# Patient Record
Sex: Female | Born: 1950 | Race: White | Hispanic: No | Marital: Married | Smoking: Never smoker
Health system: Southern US, Community
[De-identification: ages and names within clinical notes are randomized; demographics above are authoritative.]

## PROBLEM LIST (undated history)

## (undated) DIAGNOSIS — M5136 Other intervertebral disc degeneration, lumbar region: Secondary | ICD-10-CM

## (undated) DIAGNOSIS — J45909 Unspecified asthma, uncomplicated: Secondary | ICD-10-CM

## (undated) DIAGNOSIS — S42309A Unspecified fracture of shaft of humerus, unspecified arm, initial encounter for closed fracture: Secondary | ICD-10-CM

## (undated) DIAGNOSIS — I10 Essential (primary) hypertension: Secondary | ICD-10-CM

## (undated) DIAGNOSIS — M109 Gout, unspecified: Secondary | ICD-10-CM

## (undated) DIAGNOSIS — M5030 Other cervical disc degeneration, unspecified cervical region: Secondary | ICD-10-CM

## (undated) DIAGNOSIS — N9489 Other specified conditions associated with female genital organs and menstrual cycle: Secondary | ICD-10-CM

## (undated) DIAGNOSIS — F419 Anxiety disorder, unspecified: Secondary | ICD-10-CM

## (undated) DIAGNOSIS — F1020 Alcohol dependence, uncomplicated: Secondary | ICD-10-CM

## (undated) DIAGNOSIS — M51369 Other intervertebral disc degeneration, lumbar region without mention of lumbar back pain or lower extremity pain: Secondary | ICD-10-CM

## (undated) HISTORY — DX: Other cervical disc degeneration, unspecified cervical region: M50.30

## (undated) HISTORY — DX: Other intervertebral disc degeneration, lumbar region: M51.36

## (undated) HISTORY — DX: Anxiety disorder, unspecified: F41.9

## (undated) HISTORY — DX: Other specified conditions associated with female genital organs and menstrual cycle: N94.89

## (undated) HISTORY — DX: Other intervertebral disc degeneration, lumbar region without mention of lumbar back pain or lower extremity pain: M51.369

## (undated) HISTORY — DX: Alcohol dependence, uncomplicated: F10.20

## (undated) HISTORY — DX: Unspecified asthma, uncomplicated: J45.909

## (undated) HISTORY — PX: LAPAROTOMY: SHX154

---

## 1988-06-07 HISTORY — PX: COLOSTOMY: SHX63

## 1988-06-07 HISTORY — PX: COLON SURGERY: SHX602

## 1989-06-07 HISTORY — PX: COLOSTOMY REVERSAL: SHX5782

## 2004-05-14 ENCOUNTER — Ambulatory Visit: Payer: Self-pay | Admitting: Family Medicine

## 2004-05-18 ENCOUNTER — Ambulatory Visit: Payer: Self-pay | Admitting: *Deleted

## 2004-06-08 ENCOUNTER — Ambulatory Visit: Payer: Self-pay | Admitting: Family Medicine

## 2004-09-23 ENCOUNTER — Emergency Department (HOSPITAL_COMMUNITY): Admission: EM | Admit: 2004-09-23 | Discharge: 2004-09-23 | Payer: Self-pay | Admitting: Emergency Medicine

## 2004-10-06 ENCOUNTER — Ambulatory Visit: Payer: Self-pay | Admitting: Family Medicine

## 2004-10-18 ENCOUNTER — Emergency Department (HOSPITAL_COMMUNITY): Admission: EM | Admit: 2004-10-18 | Discharge: 2004-10-18 | Payer: Self-pay | Admitting: Emergency Medicine

## 2005-03-23 ENCOUNTER — Encounter: Admission: RE | Admit: 2005-03-23 | Discharge: 2005-03-23 | Payer: Self-pay | Admitting: General Surgery

## 2005-05-03 ENCOUNTER — Emergency Department (HOSPITAL_COMMUNITY): Admission: EM | Admit: 2005-05-03 | Discharge: 2005-05-03 | Payer: Self-pay | Admitting: Emergency Medicine

## 2009-11-10 ENCOUNTER — Emergency Department (HOSPITAL_COMMUNITY): Admission: EM | Admit: 2009-11-10 | Discharge: 2009-11-10 | Payer: Self-pay | Admitting: Emergency Medicine

## 2013-04-10 ENCOUNTER — Observation Stay (HOSPITAL_COMMUNITY)
Admission: EM | Admit: 2013-04-10 | Discharge: 2013-04-11 | Disposition: A | Discharge status: Home / Self Care | Payer: BC Managed Care – PPO | Attending: Internal Medicine | Admitting: Internal Medicine

## 2013-04-10 ENCOUNTER — Emergency Department (HOSPITAL_COMMUNITY): Payer: BC Managed Care – PPO

## 2013-04-10 ENCOUNTER — Encounter (HOSPITAL_COMMUNITY): Payer: Self-pay | Admitting: Emergency Medicine

## 2013-04-10 DIAGNOSIS — M009 Pyogenic arthritis, unspecified: Secondary | ICD-10-CM

## 2013-04-10 DIAGNOSIS — M109 Gout, unspecified: Principal | ICD-10-CM | POA: Insufficient documentation

## 2013-04-10 DIAGNOSIS — L03115 Cellulitis of right lower limb: Secondary | ICD-10-CM | POA: Diagnosis present

## 2013-04-10 DIAGNOSIS — I10 Essential (primary) hypertension: Secondary | ICD-10-CM | POA: Diagnosis present

## 2013-04-10 DIAGNOSIS — L089 Local infection of the skin and subcutaneous tissue, unspecified: Secondary | ICD-10-CM

## 2013-04-10 DIAGNOSIS — L02619 Cutaneous abscess of unspecified foot: Secondary | ICD-10-CM | POA: Insufficient documentation

## 2013-04-10 HISTORY — DX: Unspecified fracture of shaft of humerus, unspecified arm, initial encounter for closed fracture: S42.309A

## 2013-04-10 HISTORY — DX: Essential (primary) hypertension: I10

## 2013-04-10 LAB — BASIC METABOLIC PANEL
BUN: 4 mg/dL — ABNORMAL LOW (ref 6–23)
CO2: 27 mEq/L (ref 19–32)
Calcium: 9.6 mg/dL (ref 8.4–10.5)
Chloride: 95 mEq/L — ABNORMAL LOW (ref 96–112)
GFR calc Af Amer: >90 mL/min (ref >90)
GFR calc non Af Amer: 88 mL/min — ABNORMAL LOW (ref >90)
Glucose, Bld: 108 mg/dL — ABNORMAL HIGH (ref 70–99)
Potassium: 3.4 mEq/L — ABNORMAL LOW (ref 3.5–5.1)
Sodium: 134 mEq/L — ABNORMAL LOW (ref 135–145)

## 2013-04-10 LAB — CBC WITH DIFFERENTIAL/PLATELET
Basophils Relative: 1 % (ref 0–1)
Eosinophils Relative: 0 % (ref 0–5)
Hemoglobin: 13.5 g/dL (ref 12.0–15.0)
Lymphocytes Relative: 9 % — ABNORMAL LOW (ref 12–46)
Lymphs Abs: 1.4 10*3/uL (ref 0.7–4.0)
MCH: 30.5 pg (ref 26.0–34.0)
MCHC: 35.5 g/dL (ref 30.0–36.0)
MCV: 85.8 fL (ref 78.0–100.0)
Monocytes Relative: 6 % (ref 3–12)
Neutro Abs: 13.1 10*3/uL — ABNORMAL HIGH (ref 1.7–7.7)
Neutrophils Relative %: 84 % — ABNORMAL HIGH (ref 43–77)
Platelets: 299 10*3/uL (ref 150–400)
RBC: 4.43 MIL/uL (ref 3.87–5.11)
RDW: 12.7 % (ref 11.5–15.5)
WBC: 15.6 10*3/uL — ABNORMAL HIGH (ref 4.0–10.5)

## 2013-04-10 MED ORDER — HYDROMORPHONE HCL PF 1 MG/ML IJ SOLN
1.0000 mg | Freq: Once | INTRAMUSCULAR | Status: AC
  Administered 2013-04-10: 1 mg via INTRAMUSCULAR
  Filled 2013-04-10: qty 1

## 2013-04-10 MED ORDER — KETOROLAC TROMETHAMINE 60 MG/2ML IM SOLN
60.0000 mg | Freq: Once | INTRAMUSCULAR | Status: AC
  Administered 2013-04-10: 60 mg via INTRAMUSCULAR
  Filled 2013-04-10: qty 2

## 2013-04-10 MED ORDER — OXYCODONE-ACETAMINOPHEN 5-325 MG PO TABS
1.0000 | ORAL_TABLET | Freq: Once | ORAL | Status: AC
  Administered 2013-04-10: 1 via ORAL
  Filled 2013-04-10: qty 1

## 2013-04-10 MED ORDER — ONDANSETRON 4 MG PO TBDP
8.0000 mg | ORAL_TABLET | Freq: Once | ORAL | Status: AC
  Administered 2013-04-10: 8 mg via ORAL
  Filled 2013-04-10: qty 2

## 2013-04-10 NOTE — ED Notes (Signed)
Onset one day ago right foot pain due to change of the weather per patient. States pain worsening today currently 10/10 "deep pain" with swelling.

## 2013-04-10 NOTE — ED Notes (Signed)
MRI notified of pt's MRI.

## 2013-04-10 NOTE — ED Provider Notes (Signed)
[name]: [id number]     Arrival date & time 04/10/13  1602 History  This chart was scribed for non-physician practitioner Jaynie Crumble, PA, working with Derwood Kaplan, MD by Ronal Fear, ED scribe. This patient was seen in room TR08C/TR08C and the patient's care was started at 5:06 PM.    Chief Complaint  Patient presents with  . Foot Pain    Patient is a 62 y.o. female presenting with lower extremity pain. The history is provided by the patient. No language interpreter was used.  Foot Pain This is a recurrent problem. The current episode started yesterday. The problem occurs constantly. The problem has not changed since onset.Pertinent negatives include no chest pain. The symptoms are aggravated by walking and standing. Nothing relieves the symptoms.  HPI Comments: Lori [name] is a 62 y.o. female who presents to the Emergency Department complaining of sudden onset foot pain that began last night. She states that the pain was sharp at the center of her foot and may be due to the weather changes.  This morning there is an intense pressure and throbbing pain. Pt has taken aspirin with no relief. Pt states that she has had similar occurrences but not this severe. States about 2 weeks ago, she had pain in the same place but it went away after 2 days. Pt has chills but denies fever. States husband had MRSA infection 1 year ago. Pt denies any injuries to the foot.  Past Medical History  Diagnosis Date  . Hypertension    History reviewed. No pertinent past surgical history. No family history on file. History  Substance Use Topics  . Smoking status: Never Smoker   . Smokeless tobacco: Not on file  . Alcohol Use: No   OB History   Grav Para Term Preterm Abortions TAB SAB Ect Mult Living                 Review of Systems  Constitutional: Positive for chills. Negative for fever.  Cardiovascular: Negative for chest pain.  Gastrointestinal: Positive for nausea. Negative for vomiting.   Musculoskeletal: Positive for arthralgias.  All other systems reviewed and are negative.    Allergies  Review of patient's allergies indicates no known allergies.  Home Medications  No current outpatient prescriptions on file. BP 180/104  Pulse 86  Temp(Src) 98.8 F (37.1 C) (Oral)  Resp 20  Ht 5\' 3"  (1.6 m)  Wt 120 lb (54.432 kg)  BMI 21.26 kg/m2  SpO2 98% Physical Exam  Nursing note and vitals reviewed. Constitutional: She appears well-developed and well-nourished. No distress.  HENT:  Head: Normocephalic.  Eyes: Conjunctivae are normal.  Neck: Neck supple.  Cardiovascular: Normal rate, regular rhythm and normal heart sounds.   Pulmonary/Chest: Effort normal and breath sounds normal. No respiratory distress. She has no wheezes. She has no rales.  Musculoskeletal:  Swelling noted to the dorsal right foot, area is erythematous, warm to the touch, entire foot from MTP joints to the ankle is tender over the dorsal and plantar surfaces. Pain with range of motion of any of the talus. Toes appear normal with good cap refill less than 2 seconds. Normal sensation distally. No tenderness over the ankle joint.  Neurological: She is alert.  Skin: Skin is warm and dry.    ED Course  Procedures (including critical care time) DIAGNOSTIC STUDIES: Oxygen Saturation is 98% on RA, normal by my interpretation.    COORDINATION OF CARE:    5:13 PM- Pt advised of plan for  treatment including possible reasons for the foot pain, and a consult with an attending doctor. Pt will be given pain and nausea medication and pt agrees.  Labs Review Labs Reviewed - No data to display  Imaging Review Dg Foot Complete Right  04/10/2013   CLINICAL DATA:  Pain and swelling  EXAM: RIGHT FOOT COMPLETE - 3+ VIEW  COMPARISON:  None.  FINDINGS: Frontal, oblique, and lateral views were obtained. There is no fracture or dislocation. There is moderate osteoarthritic change in the 1st MTP joint with mild hallux  valgus deformity and early bunion formation in this area. There is calcification just lateral to the 1st MTP joint, probably arthropathic in etiology. There is mild narrowing of all PIP and DIP joints. There is spurring in the dorsal midfoot. No erosive change.  IMPRESSION: Multifocal osteoarthritic change. Mild hallux valgus deformity with early bunion formation at the 1st MTP joint. No fracture or dislocation.   Electronically Signed   By: Bretta Bang M.D.   On: 04/10/2013 17:01    EKG Interpretation   None       MDM   1. Right foot infection   2. Septic arthritis of foot    Bedside US showed possible fluid around the food. Radiology advised official foot ultrasound. [place] as above, recommended MRI.     1:05 AM Prelim MRI read by Dr. Margo Aye showing possible osteomyelitis with septic arthritis of the mid foot.  I discussed with Dr. Carola Frost orthopedics, who recommended medical admission, will consult in AM. Asked if recommend any antibiotics, deferred to medicine.   Spoke with [facility] will admit. Will put antibiotics in.    I personally performed the services described in this documentation, which was scribed in my presence. The recorded information has been reviewed and is accurate.    Lottie Mussel, PA-C 04/11/13 0109

## 2013-04-10 NOTE — ED Notes (Signed)
Per family, pt is nauseous.  Requesting some nausea medication.

## 2013-04-11 ENCOUNTER — Encounter (HOSPITAL_COMMUNITY): Payer: Self-pay | Admitting: Orthopedic Surgery

## 2013-04-11 DIAGNOSIS — L02619 Cutaneous abscess of unspecified foot: Secondary | ICD-10-CM

## 2013-04-11 DIAGNOSIS — L03115 Cellulitis of right lower limb: Secondary | ICD-10-CM | POA: Diagnosis present

## 2013-04-11 DIAGNOSIS — I10 Essential (primary) hypertension: Secondary | ICD-10-CM

## 2013-04-11 DIAGNOSIS — L089 Local infection of the skin and subcutaneous tissue, unspecified: Secondary | ICD-10-CM

## 2013-04-11 LAB — COMPREHENSIVE METABOLIC PANEL
ALT: 9 U/L (ref 0–35)
AST: 15 U/L (ref 0–37)
BUN: 9 mg/dL (ref 6–23)
CO2: 24 mEq/L (ref 19–32)
Calcium: 8.5 mg/dL (ref 8.4–10.5)
GFR calc Af Amer: 82 mL/min — ABNORMAL LOW (ref >90)
GFR calc non Af Amer: 70 mL/min — ABNORMAL LOW (ref >90)
Glucose, Bld: 114 mg/dL — ABNORMAL HIGH (ref 70–99)
Potassium: 3.3 mEq/L — ABNORMAL LOW (ref 3.5–5.1)
Sodium: 132 mEq/L — ABNORMAL LOW (ref 135–145)
Total Protein: 6.7 g/dL (ref 6.0–8.3)

## 2013-04-11 LAB — PROTIME-INR
INR: 1.13 (ref 0.00–1.49)
Prothrombin Time: 14.3 seconds (ref 11.6–15.2)

## 2013-04-11 LAB — CBC
HCT: 32.9 % — ABNORMAL LOW (ref 36.0–46.0)
Hemoglobin: 11.7 g/dL — ABNORMAL LOW (ref 12.0–15.0)
MCH: 30.8 pg (ref 26.0–34.0)
MCHC: 35.6 g/dL (ref 30.0–36.0)
Platelets: 251 10*3/uL (ref 150–400)
RDW: 13 % (ref 11.5–15.5)
WBC: 10 10*3/uL (ref 4.0–10.5)

## 2013-04-11 LAB — GLUCOSE, CAPILLARY: Glucose-Capillary: 100 mg/dL — ABNORMAL HIGH (ref 70–99)

## 2013-04-11 LAB — C-REACTIVE PROTEIN: CRP: 1.9 mg/dL — ABNORMAL HIGH (ref <0.60)

## 2013-04-11 MED ORDER — ONDANSETRON HCL 4 MG/2ML IJ SOLN
4.0000 mg | Freq: Three times a day (TID) | INTRAMUSCULAR | Status: AC | PRN
  Filled 2013-04-11: qty 2

## 2013-04-11 MED ORDER — COLCHICINE 0.6 MG PO TABS: 0.6000 mg | ORAL_TABLET | Freq: Two times a day (BID) | ORAL | Status: DC

## 2013-04-11 MED ORDER — COLCHICINE 0.6 MG PO TABS
0.6000 mg | ORAL_TABLET | Freq: Three times a day (TID) | ORAL | Status: DC
  Administered 2013-04-11 (×2): 0.6 mg via ORAL
  Filled 2013-04-11 (×3): qty 1

## 2013-04-11 MED ORDER — VANCOMYCIN HCL 500 MG IV SOLR
500.0000 mg | Freq: Two times a day (BID) | INTRAVENOUS | Status: DC
  Administered 2013-04-11: 14:00:00 500 mg via INTRAVENOUS
  Filled 2013-04-11 (×2): qty 500

## 2013-04-11 MED ORDER — SODIUM CHLORIDE 0.9 % IV SOLN: INTRAVENOUS | Status: DC

## 2013-04-11 MED ORDER — HYDROMORPHONE HCL PF 1 MG/ML IJ SOLN: 1.0000 mg | INTRAMUSCULAR | Status: AC | PRN

## 2013-04-11 MED ORDER — HYDROCODONE-ACETAMINOPHEN 5-325 MG PO TABS: 1.0000 | ORAL_TABLET | Freq: Four times a day (QID) | ORAL | Status: DC | PRN

## 2013-04-11 MED ORDER — ASPIRIN 81 MG PO TABS: 81.0000 mg | ORAL_TABLET | Freq: Every day | ORAL | Status: DC

## 2013-04-11 MED ORDER — SODIUM CHLORIDE 0.9 % IV SOLN
INTRAVENOUS | Status: DC
  Administered 2013-04-11: 03:00:00 via INTRAVENOUS

## 2013-04-11 MED ORDER — HYDROMORPHONE HCL PF 1 MG/ML IJ SOLN
1.0000 mg | Freq: Once | INTRAMUSCULAR | Status: AC
  Administered 2013-04-11: 1 mg via INTRAVENOUS
  Filled 2013-04-11: qty 1

## 2013-04-11 MED ORDER — ONDANSETRON HCL 4 MG/2ML IJ SOLN
4.0000 mg | Freq: Four times a day (QID) | INTRAMUSCULAR | Status: DC | PRN
  Administered 2013-04-11: 4 mg via INTRAVENOUS

## 2013-04-11 MED ORDER — LISINOPRIL 10 MG PO TABS
10.0000 mg | ORAL_TABLET | Freq: Every day | ORAL | Status: DC
  Administered 2013-04-11: 10 mg via ORAL
  Filled 2013-04-11: qty 1

## 2013-04-11 MED ORDER — OXYCODONE HCL 5 MG PO TABS
5.0000 mg | ORAL_TABLET | ORAL | Status: DC | PRN
  Administered 2013-04-11 (×2): 5 mg via ORAL
  Filled 2013-04-11 (×2): qty 1

## 2013-04-11 MED ORDER — ASPIRIN EC 81 MG PO TBEC
81.0000 mg | DELAYED_RELEASE_TABLET | Freq: Every day | ORAL | Status: DC
  Administered 2013-04-11: 10:00:00 81 mg via ORAL
  Filled 2013-04-11: qty 1

## 2013-04-11 MED ORDER — ENOXAPARIN SODIUM 40 MG/0.4ML ~~LOC~~ SOLN
40.0000 mg | SUBCUTANEOUS | Status: DC
  Administered 2013-04-11: 07:00:00 40 mg via SUBCUTANEOUS
  Filled 2013-04-11 (×2): qty 0.4

## 2013-04-11 MED ORDER — VANCOMYCIN HCL IN DEXTROSE 750-5 MG/150ML-% IV SOLN
750.0000 mg | Freq: Once | INTRAVENOUS | Status: AC
  Administered 2013-04-11: 750 mg via INTRAVENOUS
  Filled 2013-04-11: qty 150

## 2013-04-11 MED ORDER — ONDANSETRON HCL 4 MG PO TABS: 4.0000 mg | ORAL_TABLET | Freq: Four times a day (QID) | ORAL | Status: DC | PRN

## 2013-04-11 MED ORDER — POTASSIUM CHLORIDE CRYS ER 20 MEQ PO TBCR
40.0000 meq | EXTENDED_RELEASE_TABLET | Freq: Once | ORAL | Status: AC
  Administered 2013-04-11: 40 meq via ORAL
  Filled 2013-04-11: qty 2

## 2013-04-11 NOTE — Progress Notes (Signed)
[facility] Consult  Pt seen and examined See dictation    Assessment and Plan  1. R foot pain   Acute arthritic flare vs Neuropathic foot   Pt gives a hx that is very consistent with arthritis  Pain associated with changes in barometric pressure, increased pain with increased activity, worsening pain later in day  Pt does have a family hx of gout and OA  Pt denies hx of DM  Only change this time is that the pain did not go away in 24 hours.  Pt does have some swelling present to dorsum of foot as well as altered sensorium to her foot  Does not appear to be osteomyelitis    Pt denies any acute trauma, no recent illness or surgeries   Will check CRP, ESR  No operative interventions indicated at this time   Add anti-inflammatories for pain control  Noted that pt started on colchicine  2. HTN  Home meds    3. Pain  Low dose narcs  NSAIDs- minimize due to concomitant ACEI use  4. Activity   OOB as tolerated  WBAT  5. DVT/PE prophylaxis  Per primary team  Does not need from ortho perspective  6. ID  Pt on vanc  Do not think pt needs to be on this will discuss with attending  7. Dispo  Up ad lib  Suspect pt will be ready for dc tomorrow  Will see in follow up as outpt   Will see tomorrow as well  Mearl Latin, PA-C [facility] [phone] (P) 04/11/2013 10:41 AM

## 2013-04-11 NOTE — H&P (Signed)
[facility] History and Physical  Patient: Lori [name]  UJW:[id number]  DOB: 08-Jun-1950  DOA: 04/10/2013  Referring physician: Dr. Rhunette Croft PCP: Judene Companion, MD  Consults:   orthopedics  Chief Complaint: Right leg pain  HPI: DELIYAH [name] is a 62 y.o. female with Past medical history of hypertension. She presented today with the complaint of pain in her dorsum of the right foot that started yesterday. The pain feels like sharp, like a pen going to her feet. She says that she had similar pains in the past which is associated with weather 2 weeks ago and resolved. She mentions that this pain is similar in nature but severe in intensity. She denies any trauma or fever or chills. She denies history of diabetes. She denies any recent surgery or procedures. She denies any hardware insertion.  Review of Systems: as mentioned in the history of present illness.  A Comprehensive review of the other systems is negative.  Past Medical History  Diagnosis Date  . Hypertension    History reviewed. No pertinent past surgical history. Social History:  reports that she has never smoked. She does not have any smokeless tobacco history on file. She reports that she does not drink alcohol or use illicit drugs. Patient is coming from home. Independent for most of her  ADL.  No Known Allergies  No family history on file.  Prior to Admission medications   Medication Sig Start Date End Date Taking? Authorizing Provider  aspirin 81 MG tablet Take 81 mg by mouth daily.   Yes Historical Provider, MD  lisinopril (PRINIVIL,ZESTRIL) 10 MG tablet Take 10 mg by mouth daily.   Yes Historical Provider, MD    Physical Exam: Filed Vitals:   04/10/13 1617 04/10/13 2349 04/11/13 0244 04/11/13 0441  BP: 180/104 122/79 126/77 103/57  Pulse: 86 62 55 61  Temp: 98.8 F (37.1 C) 97.5 F (36.4 C) 97.6 F (36.4 C) 98.3 F (36.8 C)  TempSrc: Oral  Oral Oral  Resp: 20 16 18 18   Height: 5\' 3"  (1.6  m)  5\' 3"  (1.6 m)   Weight: 54.432 kg (120 lb)  46.9 kg (103 lb 6.3 oz)   SpO2: 98% 100% 93% 98%    General: Alert, Awake and Oriented to Time, Place and Person. Appear in mild distress Eyes: PERRL ENT: Oral Mucosa clear moist. Neck: no JVD,   Cardiovascular: S1 and S2 Present, no Murmur, Peripheral Pulses Present Respiratory: Bilateral Air entry equal and Decreased, Clear to Auscultation,  no Crackles,no wheezes Abdomen: Bowel Sound Present, Soft and Non tender Skin: no Rash Extremities: Right foot dorsum swollen, redness, and tender. No other Pedal edema, no calf tenderness Neurologic: Grossly Unremarkable.  Labs on Admission:  CBC:  Recent Labs Lab 04/10/13 1720  WBC 15.6*  NEUTROABS 13.1*  HGB 13.5  HCT 38.0  MCV 85.8  PLT 299    CMP     Component Value Date/Time   NA 134* 04/10/2013 1720   K 3.4* 04/10/2013 1720   CL 95* 04/10/2013 1720   CO2 27 04/10/2013 1720   GLUCOSE 108* 04/10/2013 1720   BUN 4* 04/10/2013 1720   CREATININE 0.75 04/10/2013 1720   CALCIUM 9.6 04/10/2013 1720   GFRNONAA 88* 04/10/2013 1720   GFRAA >90 04/10/2013 1720    No results found for this basename: LIPASE, AMYLASE,  in the last 168 hours No results found for this basename: AMMONIA,  in the last 168 hours  Cardiac Enzymes: No results found  for this basename: CKTOTAL, CKMB, CKMBINDEX, TROPONINI,  in the last 168 hours  BNP (last 3 results) No results found for this basename: PROBNP,  in the last 8760 hours  Radiological Exams on Admission: [place] Extrem Low Right Ltd  04/10/2013   CLINICAL DATA:  62 year old female with abrupt onset soft tissue swelling and pain at the dorsum of the foot. Mild erythema. No diabetes or known injury. Pitting edema, query abscess.  EXAM: RIGHT LOWER EXTREMITY SOFT TISSUE ULTRASOUND LIMITED  TECHNIQUE: Ultrasound examination was performed including evaluation of the muscles, tendons, joint, and adjacent soft tissues.  COMPARISON:  Left foot radiographs on the same  day reported separately.  FINDINGS: Area of clinical concern corresponds to the mid tarsal bones dorsally where soft tissue swelling was evident on the comparison, along with degenerative changes (e.g. At the navicular cuneiform articulations).  Ultrasound scanning of this area demonstrates an irregular shaped fluid collection with encompassing 7 x 15 by about 5 mm (transverse by AP by CC). Several of these images (image 7) suggest the fluid collection communicates with the underlying joint space. There is mild describes that  There is mild surrounding hypervascularity. There is superimposed subcutaneous edema. There is an adjacent vessel which may be the dorsalis pedis (images 3 and 4).  Images of the contralateral left foot were obtained in the same area and appear normal.  IMPRESSION: Irregular fluid collection at the dorsum of the midfoot, with suggestion of communication with the underlying joint, and chronic joint degeneration evident here on the comparison radiographs.  Mild surrounding subcutaneous edema.  Overall, the constellation favors a superinfected degenerative synovial or ganglion cyst.  MRI of the foot without and with contrast may be valuable for additional characterization prior to definitive treatment.   Electronically Signed   By: Augusto Gamble M.D.   On: 04/10/2013 20:29   Dg Foot Complete Right  04/10/2013   CLINICAL DATA:  Pain and swelling  EXAM: RIGHT FOOT COMPLETE - 3+ VIEW  COMPARISON:  None.  FINDINGS: Frontal, oblique, and lateral views were obtained. There is no fracture or dislocation. There is moderate osteoarthritic change in the 1st MTP joint with mild hallux valgus deformity and early bunion formation in this area. There is calcification just lateral to the 1st MTP joint, probably arthropathic in etiology. There is mild narrowing of all PIP and DIP joints. There is spurring in the dorsal midfoot. No erosive change.  IMPRESSION: Multifocal osteoarthritic change. Mild hallux valgus  deformity with early bunion formation at the 1st MTP joint. No fracture or dislocation.   Electronically Signed   By: Bretta Bang M.D.   On: 04/10/2013 17:01    Assessment/Plan Principal Problem:   Cellulitis of right foot Active Problems:   Essential hypertension, benign   1. Cellulitis of right foot The patient has swelling and redness of her right foot associated with pain. She mentions the symptoms suddenly appeared today but actually has been ongoing since last 2 weeks. She has white count and her MRI is suggestive of possible septic arthritis versus osteomyelitis. Orthopedics has been consulted by ED recommends no acute intervention. Patient will be admitted for IV antibiotics. Considering her history of exposure with MRSA we would broadly cover her with IV vancomycin. She does not have history of diabetes therefore pseudomonal coverage is not required at present.  2. Hypertension Continue aspirin and lisinopril  3. Pain management Oral OxyIR  4. Nausea Zofran when necessary  DVT Prophylaxis: subcutaneous Heparin Nutrition: Cardiac diet  Code  Status: Full  Disposition: Admitted to inpatient in med-surge.  Author: Lynden Oxford, MD [facility] Pager: [phone] 04/11/2013, 5:46 AM    If 7PM-7AM, please contact night-coverage [email or web address] Password [email or web address]

## 2013-04-11 NOTE — Progress Notes (Signed)
ANTIBIOTIC CONSULT NOTE - INITIAL  Pharmacy Consult for vancomycin  Indication: suspected osteomyelitis of foot  No Known Allergies  Patient Measurements: Height: 5\' 3"  (160 cm) Weight: 120 lb (54.432 kg) IBW/kg (Calculated) : 52.4 Adjusted Body Weight:   Vital Signs: Temp: 97.5 F (36.4 C) (11/04 2349) Temp src: Oral (11/04 1617) BP: 122/79 mmHg (11/04 2349) Pulse Rate: 62 (11/04 2349) Intake/Output from previous day:   Intake/Output from this shift:    Labs:  Recent Labs  04/10/13 1720  WBC 15.6*  HGB 13.5  PLT 299  CREATININE 0.75   Estimated Creatinine Clearance: 59.5 ml/min (by C-G formula based on Cr of 0.75). No results found for this basename: VANCOTROUGH, VANCOPEAK, VANCORANDOM, GENTTROUGH, GENTPEAK, GENTRANDOM, TOBRATROUGH, TOBRAPEAK, TOBRARND, AMIKACINPEAK, AMIKACINTROU, AMIKACIN,  in the last 72 hours   Microbiology: No results found for this or any previous visit (from the past 720 hour(s)).  Medical History: Past Medical History  Diagnosis Date  . 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 In non-diabetic pt. Husband postive for mrsa infection 1 yr ago.   Goal of Therapy:  Vancomycin trough level 15-20 mcg/ml  Plan:  Vancomycin 750 mg x1 then 500 mg q12h vanc trough before 4th or 5th dose. F/u cx's and clinical course.   Janice Coffin 04/11/2013,1:21 AM

## 2013-04-11 NOTE — ED Provider Notes (Signed)
Shared service with midlevel provider. I have personally seen and examined the patient, providing direct face to face care, presenting with the chief complaint of foot pain. Physical exam findings include edematous foot, with mild erythema, no callor and intact ROM of the ankle. Bedside US showed some fluid pockets, formal US confirmed the finding and MRI showed deep infection. Plan will be admit with ortho f/u. I have reviewed the nursing documentation on past medical history, family history, and social history.   Derwood Kaplan, MD 04/11/13 2137

## 2013-04-11 NOTE — Progress Notes (Signed)
Lori [name] discharged Home per MD order.  Discharge instructions reviewed and discussed with the patient, all questions and concerns answered. Copy of instructions, scripts and care notes for gout, arthritis, colchicine & vicodin given to patient.    Medication List         aspirin 81 MG tablet  Take 81 mg by mouth daily.     colchicine 0.6 MG tablet  Take 1 tablet (0.6 mg total) by mouth 2 (two) times daily.     HYDROcodone-acetaminophen 5-325 MG per tablet  Commonly known as:  NORCO/VICODIN  Take 1 tablet by mouth every 6 (six) hours as needed for moderate pain.     lisinopril 10 MG tablet  Commonly known as:  PRINIVIL,ZESTRIL  Take 10 mg by mouth daily.        Patients skin is clean, dry and intact, no evidence of skin break down. IV site discontinued and catheter remains intact. Site without signs and symptoms of complications. Dressing and pressure applied.  Patient escorted to car by NT in a wheelchair,  no distress noted upon discharge.  Laural Benes, [name] 04/11/2013 5:28 PM

## 2013-04-11 NOTE — Plan of Care (Signed)
Problem: Phase I Progression Outcomes Goal: Initial discharge plan identified Outcome: Completed/Met Date Met:  04/11/13 To return home with husband
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
## 2013-04-13 NOTE — Progress Notes (Signed)
Please see full disctation. Myrene Galas, MD [facility] [phone] (p)

## 2013-04-13 NOTE — Consult Note (Signed)
I have reviewed and discussed in detail with Mr. Lori [name] the patient's presentation, examination findings, and I formulated the plan outlined above. I have also discussed with attending medical physician as well as Lajoyce Corners, who would like to see the patient in follow up.  Myrene Galas, MD [facility] [phone] (p)

## 2013-04-17 LAB — CULTURE, BLOOD (ROUTINE X 2)
Culture: NO GROWTH
Culture: NO GROWTH

## 2013-07-04 ENCOUNTER — Encounter: Payer: Self-pay | Admitting: Cardiovascular Disease

## 2013-07-04 ENCOUNTER — Ambulatory Visit: Payer: BC Managed Care – PPO | Admitting: Cardiovascular Disease

## 2013-07-05 ENCOUNTER — Encounter: Payer: Self-pay | Admitting: Cardiovascular Disease

## 2013-07-12 ENCOUNTER — Emergency Department (HOSPITAL_COMMUNITY)
Admission: EM | Admit: 2013-07-12 | Discharge: 2013-07-13 | Disposition: A | Discharge status: Home / Self Care | Payer: BC Managed Care – PPO | Attending: Emergency Medicine | Admitting: Emergency Medicine

## 2013-07-12 ENCOUNTER — Encounter (HOSPITAL_COMMUNITY): Payer: Self-pay | Admitting: Emergency Medicine

## 2013-07-12 DIAGNOSIS — F411 Generalized anxiety disorder: Secondary | ICD-10-CM | POA: Insufficient documentation

## 2013-07-12 DIAGNOSIS — Z8781 Personal history of (healed) traumatic fracture: Secondary | ICD-10-CM | POA: Insufficient documentation

## 2013-07-12 DIAGNOSIS — Z79899 Other long term (current) drug therapy: Secondary | ICD-10-CM | POA: Insufficient documentation

## 2013-07-12 DIAGNOSIS — E876 Hypokalemia: Secondary | ICD-10-CM | POA: Insufficient documentation

## 2013-07-12 DIAGNOSIS — I10 Essential (primary) hypertension: Secondary | ICD-10-CM | POA: Insufficient documentation

## 2013-07-12 DIAGNOSIS — M542 Cervicalgia: Secondary | ICD-10-CM | POA: Insufficient documentation

## 2013-07-12 MED ORDER — LORAZEPAM 1 MG PO TABS
1.0000 mg | ORAL_TABLET | Freq: Once | ORAL | Status: AC
  Administered 2013-07-13: 1 mg via ORAL
  Filled 2013-07-12: qty 2

## 2013-07-12 NOTE — ED Notes (Signed)
Pt states she has not been feeling well for the past couple of days so she went to her dr and everything checked out ok  Pt states tonight she started having a full feeling on the left side of her neck and has a hot feeling in her head and feels sick  Pt states she had vomiting last night  Pt states she has high blood pressure and has been taking her meds on a regular basis
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
## 2013-07-13 LAB — CBC WITH DIFFERENTIAL/PLATELET
BASOS ABS: 0.1 10*3/uL (ref 0.0–0.1)
Basophils Relative: 1 % (ref 0–1)
Eosinophils Absolute: 0.3 10*3/uL (ref 0.0–0.7)
Eosinophils Relative: 4 % (ref 0–5)
HCT: 34.5 % — ABNORMAL LOW (ref 36.0–46.0)
Hemoglobin: 12.6 g/dL (ref 12.0–15.0)
Lymphocytes Relative: 49 % — ABNORMAL HIGH (ref 12–46)
Lymphs Abs: 3.5 10*3/uL (ref 0.7–4.0)
MCH: 31.2 pg (ref 26.0–34.0)
MCHC: 36.5 g/dL — AB (ref 30.0–36.0)
MCV: 85.4 fL (ref 78.0–100.0)
Monocytes Absolute: 0.6 10*3/uL (ref 0.1–1.0)
Monocytes Relative: 8 % (ref 3–12)
NEUTROS ABS: 2.7 10*3/uL (ref 1.7–7.7)
NEUTROS PCT: 38 % — AB (ref 43–77)
PLATELETS: 295 10*3/uL (ref 150–400)
RBC: 4.04 MIL/uL (ref 3.87–5.11)
RDW: 12.5 % (ref 11.5–15.5)
WBC: 7.1 10*3/uL (ref 4.0–10.5)

## 2013-07-13 LAB — COMPREHENSIVE METABOLIC PANEL
ALK PHOS: 76 U/L (ref 39–117)
ALT: 11 U/L (ref 0–35)
AST: 17 U/L (ref 0–37)
Albumin: 3.8 g/dL (ref 3.5–5.2)
BILIRUBIN TOTAL: 0.3 mg/dL (ref 0.3–1.2)
BUN: 5 mg/dL — ABNORMAL LOW (ref 6–23)
CALCIUM: 8.9 mg/dL (ref 8.4–10.5)
CO2: 24 meq/L (ref 19–32)
Chloride: 96 mEq/L (ref 96–112)
Creatinine, Ser: 0.58 mg/dL (ref 0.50–1.10)
Glucose, Bld: 105 mg/dL — ABNORMAL HIGH (ref 70–99)
Potassium: 3.3 mEq/L — ABNORMAL LOW (ref 3.7–5.3)
SODIUM: 132 meq/L — AB (ref 137–147)
Total Protein: 7.6 g/dL (ref 6.0–8.3)

## 2013-07-13 LAB — URINALYSIS, ROUTINE W REFLEX MICROSCOPIC
Bilirubin Urine: NEGATIVE
Glucose, UA: NEGATIVE mg/dL
Ketones, ur: NEGATIVE mg/dL
LEUKOCYTES UA: NEGATIVE
Nitrite: NEGATIVE
PROTEIN: NEGATIVE mg/dL
Specific Gravity, Urine: 1.003 — ABNORMAL LOW (ref 1.005–1.030)
UROBILINOGEN UA: 0.2 mg/dL (ref 0.0–1.0)
pH: 7 (ref 5.0–8.0)

## 2013-07-13 LAB — URINE MICROSCOPIC-ADD ON

## 2013-07-13 LAB — TROPONIN I: Troponin I: <0.3 ng/mL (ref <0.30)

## 2013-07-13 MED ORDER — POTASSIUM CHLORIDE ER 10 MEQ PO TBCR: 10.0000 meq | EXTENDED_RELEASE_TABLET | Freq: Every day | ORAL | Status: DC

## 2013-07-13 NOTE — Discharge Instructions (Signed)
Please call your doctor for a followup appointment within 24-48 hours. When you talk to your doctor please let them know that you were seen in the emergency department and have them acquire all of your records so that they can discuss the findings with you and formulate a treatment plan to fully care for your new and ongoing problems. ° °

## 2013-07-14 ENCOUNTER — Encounter (HOSPITAL_COMMUNITY): Payer: Self-pay | Admitting: Emergency Medicine

## 2013-07-14 ENCOUNTER — Emergency Department (HOSPITAL_COMMUNITY)
Admission: EM | Admit: 2013-07-14 | Discharge: 2013-07-14 | Disposition: A | Discharge status: Home / Self Care | Payer: BC Managed Care – PPO | Attending: Emergency Medicine | Admitting: Emergency Medicine

## 2013-07-14 DIAGNOSIS — Z79899 Other long term (current) drug therapy: Secondary | ICD-10-CM | POA: Insufficient documentation

## 2013-07-14 DIAGNOSIS — Z8781 Personal history of (healed) traumatic fracture: Secondary | ICD-10-CM | POA: Insufficient documentation

## 2013-07-14 DIAGNOSIS — F419 Anxiety disorder, unspecified: Secondary | ICD-10-CM

## 2013-07-14 DIAGNOSIS — F411 Generalized anxiety disorder: Secondary | ICD-10-CM | POA: Insufficient documentation

## 2013-07-14 DIAGNOSIS — I10 Essential (primary) hypertension: Secondary | ICD-10-CM | POA: Insufficient documentation

## 2013-07-14 DIAGNOSIS — Z7982 Long term (current) use of aspirin: Secondary | ICD-10-CM | POA: Insufficient documentation

## 2013-07-14 MED ORDER — ALPRAZOLAM 0.25 MG PO TABS: 0.2500 mg | ORAL_TABLET | Freq: Every evening | ORAL | Status: DC | PRN

## 2013-07-14 MED ORDER — ALPRAZOLAM 0.25 MG PO TABS
0.2500 mg | ORAL_TABLET | Freq: Once | ORAL | Status: AC
  Administered 2013-07-14: 0.25 mg via ORAL
  Filled 2013-07-14: qty 1

## 2013-07-14 NOTE — ED Notes (Addendum)
Pt presents with c/o of hypertension. Pt reports being seen two days ago here for the same. Pt BP on arrival was 193/111. Pt reports a throbbing headache, blurred vision (however pt does not have corrective lenses with her), dizziness, lightheadedness, nausea, and fatigue. Pt is A/O x4.

## 2013-07-14 NOTE — ED Notes (Signed)
Dr Allen at bedside  
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
## 2013-07-14 NOTE — Discharge Instructions (Signed)
Followup with your Dr. Monday to have your blood pressure medications adjusted. Arterial Hypertension Arterial hypertension (high blood pressure) is a condition of elevated pressure in your blood vessels. Hypertension over a long period of time is a risk factor for strokes, heart attacks, and heart failure. It is also the leading cause of kidney (renal) failure.  CAUSES   In Adults -- Over 90% of all hypertension has no known cause. This is called essential or primary hypertension. In the other 10% of people with hypertension, the increase in blood pressure is caused by another disorder. This is called secondary hypertension. Important causes of secondary hypertension are:  Heavy alcohol use.  Obstructive sleep apnea.  Hyperaldosterosim (Conn's syndrome).  Steroid use.  Chronic kidney failure.  Hyperparathyroidism.  Medications.  Renal artery stenosis.  Pheochromocytoma.  Cushing's disease.  Coarctation of the aorta.  Scleroderma renal crisis.  Licorice (in excessive amounts).  Drugs (cocaine, methamphetamine). Your caregiver can explain any items above that apply to you.  In Children -- Secondary hypertension is more common and should always be considered.  Pregnancy -- Few women of childbearing age have high blood pressure. However, up to 10% of them develop hypertension of pregnancy. Generally, this will not harm the woman. It may be a sign of 3 complications of pregnancy: preeclampsia, HELLP syndrome, and eclampsia. Follow up and control with medication is necessary. SYMPTOMS   This condition normally does not produce any noticeable symptoms. It is usually found during a routine exam.  Malignant hypertension is a late problem of high blood pressure. It may have the following symptoms:  Headaches.  Blurred vision.  End-organ damage (this means your kidneys, heart, lungs, and other organs are being damaged).  Stressful situations can increase the blood pressure.  If a person with normal blood pressure has their blood pressure go up while being seen by their caregiver, this is often termed "white coat hypertension." Its importance is not known. It may be related with eventually developing hypertension or complications of hypertension.  Hypertension is often confused with mental tension, stress, and anxiety. DIAGNOSIS  The diagnosis is made by 3 separate blood pressure measurements. They are taken at least 1 week apart from each other. If there is organ damage from hypertension, the diagnosis may be made without repeat measurements. Hypertension is usually identified by having blood pressure readings:  Above 140/90 mmHg measured in both arms, at 3 separate times, over a couple weeks.  Over 130/80 mmHg should be considered a risk factor and may require treatment in patients with diabetes. Blood pressure readings over 120/80 mmHg are called "pre-hypertension" even in non-diabetic patients. To get a true blood pressure measurement, use the following guidelines. Be aware of the factors that can alter blood pressure readings.  Take measurements at least 1 hour after caffeine.  Take measurements 30 minutes after smoking and without any stress. This is another reason to quit smoking  it raises your blood pressure.  Use a proper cuff size. Ask your caregiver if you are not sure about your cuff size.  Most home blood pressure cuffs are automatic. They will measure systolic and diastolic pressures. The systolic pressure is the pressure reading at the start of sounds. Diastolic pressure is the pressure at which the sounds disappear. If you are elderly, measure pressures in multiple postures. Try sitting, lying or standing.  Sit at rest for a minimum of 5 minutes before taking measurements.  You should not be on any medications like decongestants. These are found  in many cold medications.  Record your blood pressure readings and review them with your  caregiver. If you have hypertension:  Your caregiver may do tests to be sure you do not have secondary hypertension (see "causes" above).  Your caregiver may also look for signs of metabolic syndrome. This is also called Syndrome X or Insulin Resistance Syndrome. You may have this syndrome if you have type 2 diabetes, abdominal obesity, and abnormal blood lipids in addition to hypertension.  Your caregiver will take your medical and family history and perform a physical exam.  Diagnostic tests may include blood tests (for glucose, cholesterol, potassium, and kidney function), a urinalysis, or an EKG. Other tests may also be necessary depending on your condition. PREVENTION  There are important lifestyle issues that you can adopt to reduce your chance of developing hypertension:  Maintain a normal weight.  Limit the amount of salt (sodium) in your diet.  Exercise often.  Limit alcohol intake.  Get enough potassium in your diet. Discuss specific advice with your caregiver.  Follow a DASH diet (dietary approaches to stop hypertension). This diet is rich in fruits, vegetables, and low-fat dairy products, and avoids certain fats. PROGNOSIS  Essential hypertension cannot be cured. Lifestyle changes and medical treatment can lower blood pressure and reduce complications. The prognosis of secondary hypertension depends on the underlying cause. Many people whose hypertension is controlled with medicine or lifestyle changes can live a normal, healthy life.  RISKS AND COMPLICATIONS  While high blood pressure alone is not an illness, it often requires treatment due to its short- and long-term effects on many organs. Hypertension increases your risk for:  CVAs or strokes (cerebrovascular accident).  Heart failure due to chronically high blood pressure (hypertensive cardiomyopathy).  Heart attack (myocardial infarction).  Damage to the retina (hypertensive retinopathy).  Kidney failure  (hypertensive nephropathy). Your caregiver can explain list items above that apply to you. Treatment of hypertension can significantly reduce the risk of complications. TREATMENT   For overweight patients, weight loss and regular exercise are recommended. Physical fitness lowers blood pressure.  Mild hypertension is usually treated with diet and exercise. A diet rich in fruits and vegetables, fat-free dairy products, and foods low in fat and salt (sodium) can help lower blood pressure. Decreasing salt intake decreases blood pressure in a 1/3 of people.  Stop smoking if you are a smoker. The steps above are highly effective in reducing blood pressure. While these actions are easy to suggest, they are difficult to achieve. Most patients with moderate or severe hypertension end up requiring medications to bring their blood pressure down to a normal level. There are several classes of medications for treatment. Blood pressure pills (antihypertensives) will lower blood pressure by their different actions. Lowering the blood pressure by 10 mmHg may decrease the risk of complications by as much as 25%. The goal of treatment is effective blood pressure control. This will reduce your risk for complications. Your caregiver will help you determine the best treatment for you according to your lifestyle. What is excellent treatment for one person, may not be for you. HOME CARE INSTRUCTIONS   Do not smoke.  Follow the lifestyle changes outlined in the "Prevention" section.  If you are on medications, follow the directions carefully. Blood pressure medications must be taken as prescribed. Skipping doses reduces their benefit. It also puts you at risk for problems.  Follow up with your caregiver, as directed.  If you are asked to monitor your blood pressure at  home, follow the guidelines in the "Diagnosis" section above. SEEK MEDICAL CARE IF:   You think you are having medication side effects.  You have  recurrent headaches or lightheadedness.  You have swelling in your ankles.  You have trouble with your vision. SEEK IMMEDIATE MEDICAL CARE IF:   You have sudden onset of chest pain or pressure, difficulty breathing, or other symptoms of a heart attack.  You have a severe headache.  You have symptoms of a stroke (such as sudden weakness, difficulty speaking, difficulty walking). MAKE SURE YOU:   Understand these instructions.  Will watch your condition.  Will get help right away if you are not doing well or get worse. Document Released: 05/24/2005 Document Revised: 08/16/2011 Document Reviewed: 12/22/2006 [facility] Patient Information 2014 [place].

## 2013-07-14 NOTE — ED Notes (Addendum)
MD at bedside. 

## 2013-08-22 ENCOUNTER — Ambulatory Visit: Payer: Self-pay | Admitting: Podiatry

## 2014-10-12 ENCOUNTER — Emergency Department (HOSPITAL_COMMUNITY)
Admission: EM | Admit: 2014-10-12 | Discharge: 2014-10-12 | Disposition: A | Discharge status: Home / Self Care | Payer: 59 | Attending: Emergency Medicine | Admitting: Emergency Medicine

## 2014-10-12 ENCOUNTER — Encounter (HOSPITAL_COMMUNITY): Payer: Self-pay | Admitting: Emergency Medicine

## 2014-10-12 DIAGNOSIS — I10 Essential (primary) hypertension: Secondary | ICD-10-CM | POA: Diagnosis not present

## 2014-10-12 DIAGNOSIS — Z79899 Other long term (current) drug therapy: Secondary | ICD-10-CM | POA: Insufficient documentation

## 2014-10-12 DIAGNOSIS — Z7982 Long term (current) use of aspirin: Secondary | ICD-10-CM | POA: Diagnosis not present

## 2014-10-12 DIAGNOSIS — M10071 Idiopathic gout, right ankle and foot: Secondary | ICD-10-CM | POA: Diagnosis not present

## 2014-10-12 DIAGNOSIS — M25571 Pain in right ankle and joints of right foot: Secondary | ICD-10-CM | POA: Diagnosis present

## 2014-10-12 DIAGNOSIS — Z8781 Personal history of (healed) traumatic fracture: Secondary | ICD-10-CM | POA: Diagnosis not present

## 2014-10-12 DIAGNOSIS — M109 Gout, unspecified: Secondary | ICD-10-CM

## 2014-10-12 HISTORY — DX: Gout, unspecified: M10.9

## 2014-10-12 MED ORDER — HYDROCODONE-ACETAMINOPHEN 5-325 MG PO TABS
1.0000 | ORAL_TABLET | Freq: Once | ORAL | Status: AC
  Administered 2014-10-12: 1 via ORAL
  Filled 2014-10-12: qty 1

## 2014-10-12 MED ORDER — HYDROCODONE-ACETAMINOPHEN 5-325 MG PO TABS: 1.0000 | ORAL_TABLET | Freq: Four times a day (QID) | ORAL | Status: DC | PRN

## 2014-10-12 MED ORDER — COLCHICINE 0.6 MG PO TABS
0.6000 mg | ORAL_TABLET | Freq: Once | ORAL | Status: AC
Start: 2014-10-12 — End: 2014-10-12
  Administered 2014-10-12: 0.6 mg via ORAL
  Filled 2014-10-12 (×2): qty 1

## 2014-10-12 NOTE — Discharge Instructions (Signed)
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
## 2014-10-12 NOTE — ED Notes (Signed)
Patients right foot is currently hurting. Patient stated big toe hurts the worst.

## 2014-10-12 NOTE — ED Notes (Addendum)
Pt from home c/o right foot pain since 1700 Friday. Hx of Gout. She reports running out of her colchicine. She is also c/o nausea. She took advil at 0300 without relief.

## 2015-03-28 DIAGNOSIS — Z719 Counseling, unspecified: Secondary | ICD-10-CM | POA: Insufficient documentation

## 2015-04-29 ENCOUNTER — Ambulatory Visit (HOSPITAL_COMMUNITY)
Admission: RE | Admit: 2015-04-29 | Discharge: 2015-04-29 | Disposition: A | Discharge status: Home / Self Care | Payer: 59 | Source: Ambulatory Visit | Attending: Family Medicine | Admitting: Family Medicine

## 2015-04-29 ENCOUNTER — Other Ambulatory Visit (HOSPITAL_COMMUNITY): Payer: Self-pay | Admitting: Family Medicine

## 2015-04-29 DIAGNOSIS — R3121 Asymptomatic microscopic hematuria: Secondary | ICD-10-CM

## 2015-04-29 DIAGNOSIS — N200 Calculus of kidney: Secondary | ICD-10-CM

## 2015-04-29 DIAGNOSIS — I7090 Unspecified atherosclerosis: Secondary | ICD-10-CM | POA: Diagnosis not present

## 2015-04-29 DIAGNOSIS — R109 Unspecified abdominal pain: Secondary | ICD-10-CM | POA: Diagnosis present

## 2015-04-29 DIAGNOSIS — D259 Leiomyoma of uterus, unspecified: Secondary | ICD-10-CM | POA: Diagnosis not present

## 2015-08-01 ENCOUNTER — Telehealth: Payer: Self-pay

## 2015-08-01 ENCOUNTER — Encounter: Payer: Self-pay | Admitting: Gynecologic Oncology

## 2015-08-01 ENCOUNTER — Ambulatory Visit: Payer: 59 | Attending: Gynecology | Admitting: Gynecology

## 2015-08-01 ENCOUNTER — Encounter: Payer: Self-pay | Admitting: Gynecology

## 2015-08-01 VITALS — BP 166/91 | HR 62 | Temp 98.2°F | Resp 18 | Ht 64.0 in | Wt 106.0 lb

## 2015-08-01 DIAGNOSIS — N949 Unspecified condition associated with female genital organs and menstrual cycle: Secondary | ICD-10-CM

## 2015-08-01 DIAGNOSIS — R1909 Other intra-abdominal and pelvic swelling, mass and lump: Secondary | ICD-10-CM | POA: Diagnosis present

## 2015-08-01 DIAGNOSIS — M109 Gout, unspecified: Secondary | ICD-10-CM | POA: Diagnosis not present

## 2015-08-01 DIAGNOSIS — R1032 Left lower quadrant pain: Secondary | ICD-10-CM | POA: Insufficient documentation

## 2015-08-01 DIAGNOSIS — F419 Anxiety disorder, unspecified: Secondary | ICD-10-CM | POA: Diagnosis not present

## 2015-08-01 DIAGNOSIS — I10 Essential (primary) hypertension: Secondary | ICD-10-CM | POA: Diagnosis not present

## 2015-08-01 DIAGNOSIS — N9489 Other specified conditions associated with female genital organs and menstrual cycle: Secondary | ICD-10-CM

## 2015-08-01 NOTE — Patient Instructions (Signed)
Plan for surgery on Tues, Feb 28 at [facility] [place] with Dr. Marti Sleigh.  You will receive a phone call from [facility] with a pre-op appt for Monday.  Please call for any questions or concerns.
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
## 2015-08-01 NOTE — Telephone Encounter (Signed)
Call returned, patient states she decided to have the surgery at [facility] with Dr Fermin Schwab due to family decision . Dr Fermin Schwab updated , the patient will scheduled for surgery on August 26, 2015 with  Dr Everitt Amber . Patient [place] aware that she will receive a phone call from GYN/ONC and our Pre-op testing department . Patient denies further questions at this time .

## 2015-08-04 ENCOUNTER — Telehealth: Payer: Self-pay

## 2015-08-04 NOTE — Telephone Encounter (Signed)
Orders received from [place] to contact [facility] [phone] to rescheduled surgery for : Exploratory laparotomy, bilateral salpingoophorectomy and staging if ovarian cancer . Patient was previously scheduled here at [facility] on August 26, 2015 with Dr Everitt Amber per patient's request . Patient called today requesting to change her surgeon back to Dr Marti Sleigh at [facility]. Contacted Katharine Look at [facility] , patient was rescheduled to March 07 , 2017 at [facility] with Dr Marti Sleigh , patient to be contacted by sandra for the date and time of her Pre-Op appointment.

## 2015-08-25 ENCOUNTER — Encounter: Payer: Self-pay | Admitting: *Deleted

## 2015-08-29 ENCOUNTER — Telehealth: Payer: Self-pay

## 2015-08-29 NOTE — Telephone Encounter (Signed)
Patient contacted to request correct fax number for her dentist , Lori [name] gave Dr Luis Abed fax # [phone] and an alternate fax # [phone] , letter of clearance for upcoming dental procedure faxed to both given numbers. Transmission verification report received "OK" for both fax numbers.

## 2015-10-03 ENCOUNTER — Encounter: Payer: Self-pay | Admitting: Gynecology

## 2015-10-03 ENCOUNTER — Ambulatory Visit: Payer: 59 | Attending: Gynecology | Admitting: Gynecology

## 2015-10-03 DIAGNOSIS — R1032 Left lower quadrant pain: Secondary | ICD-10-CM | POA: Diagnosis not present

## 2015-10-03 DIAGNOSIS — I10 Essential (primary) hypertension: Secondary | ICD-10-CM | POA: Diagnosis not present

## 2015-10-03 DIAGNOSIS — Z9889 Other specified postprocedural states: Secondary | ICD-10-CM | POA: Insufficient documentation

## 2015-10-03 DIAGNOSIS — M109 Gout, unspecified: Secondary | ICD-10-CM | POA: Insufficient documentation

## 2015-10-03 DIAGNOSIS — Z79899 Other long term (current) drug therapy: Secondary | ICD-10-CM | POA: Diagnosis not present

## 2015-10-03 DIAGNOSIS — F419 Anxiety disorder, unspecified: Secondary | ICD-10-CM | POA: Diagnosis not present

## 2015-10-03 DIAGNOSIS — D271 Benign neoplasm of left ovary: Secondary | ICD-10-CM | POA: Insufficient documentation

## 2015-10-03 DIAGNOSIS — D3912 Neoplasm of uncertain behavior of left ovary: Secondary | ICD-10-CM | POA: Insufficient documentation
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
## 2015-10-03 NOTE — Patient Instructions (Signed)
Plan to follow up with Dr. Fermin Schwab in one year or sooner if needed.  Please call our office in the Fall of 2017 to schedule your appointment.

## 2015-11-07 ENCOUNTER — Ambulatory Visit: Payer: 59 | Admitting: Gynecology

## 2015-12-23 ENCOUNTER — Encounter (HOSPITAL_COMMUNITY): Payer: Self-pay | Admitting: Emergency Medicine

## 2015-12-23 ENCOUNTER — Ambulatory Visit (HOSPITAL_COMMUNITY)
Admission: EM | Admit: 2015-12-23 | Discharge: 2015-12-23 | Disposition: A | Discharge status: Home / Self Care | Payer: 59 | Attending: Family Medicine | Admitting: Family Medicine

## 2015-12-23 DIAGNOSIS — Z202 Contact with and (suspected) exposure to infections with a predominantly sexual mode of transmission: Secondary | ICD-10-CM

## 2015-12-23 DIAGNOSIS — Z711 Person with feared health complaint in whom no diagnosis is made: Secondary | ICD-10-CM

## 2015-12-23 DIAGNOSIS — F419 Anxiety disorder, unspecified: Secondary | ICD-10-CM | POA: Insufficient documentation

## 2015-12-23 DIAGNOSIS — I10 Essential (primary) hypertension: Secondary | ICD-10-CM | POA: Insufficient documentation

## 2015-12-23 DIAGNOSIS — M109 Gout, unspecified: Secondary | ICD-10-CM | POA: Insufficient documentation

## 2015-12-23 MED ORDER — CEPHALEXIN 500 MG PO CAPS
500.0000 mg | ORAL_CAPSULE | Freq: Three times a day (TID) | ORAL | Status: DC
Start: 2015-12-23 — End: 2016-03-03

## 2015-12-23 NOTE — Discharge Instructions (Signed)
Safe Sex Safe sex is about reducing the risk of giving or getting a sexually transmitted disease (STD). STDs are spread through sexual contact involving the genitals, mouth, or rectum. Some STDs can be cured and others cannot. Safe sex can also prevent unintended pregnancies.  WHAT ARE SOME SAFE SEX PRACTICES?  Limit your sexual activity to only one partner who is having sex with only you.  Talk to your partner about his or her past partners, past STDs, and drug use.  Use a condom every time you have sexual intercourse. This includes vaginal, oral, and anal sexual activity. Both females and males should wear condoms during oral sex. Only use latex or polyurethane condoms and water-based lubricants. Using petroleum-based lubricants or oils to lubricate a condom will weaken the condom and increase the chance that it will break. The condom should be in place from the beginning to the end of sexual activity. Wearing a condom reduces, but does not completely eliminate, your risk of getting or giving an STD. STDs can be spread by contact with infected body fluids and skin.  Get vaccinated for hepatitis B and HPV.  Avoid alcohol and recreational drugs, which can affect your judgment. You may forget to use a condom or participate in high-risk sex.  For females, avoid douching after sexual intercourse. Douching can spread an infection farther into the reproductive tract.  Check your body for signs of sores, blisters, rashes, or unusual discharge. See your health care provider if you notice any of these signs.  Avoid sexual contact if you have symptoms of an infection or are being treated for an STD. If you or your partner has herpes, avoid sexual contact when blisters are present. Use condoms at all other times.  If you are at risk of being infected with HIV, it is recommended that you take a prescription medicine daily to prevent HIV infection. This is called pre-exposure prophylaxis (PrEP). You are  considered at risk if:  You are a man who has sex with other men (MSM).  You are a heterosexual man or woman who is sexually active with more than one partner.  You take drugs by injection.  You are sexually active with a partner who has HIV.  Talk with your health care provider about whether you are at high risk of being infected with HIV. If you choose to begin PrEP, you should first be tested for HIV. You should then be tested every 3 months for as long as you are taking PrEP.  See your health care provider for regular screenings, exams, and tests for other STDs. Before having sex with a new partner, each of you should be screened for STDs and should talk about the results with each other. WHAT ARE THE BENEFITS OF SAFE SEX?   There is less chance of getting or giving an STD.  You can prevent unwanted or unintended pregnancies.  By discussing safe sex concerns with your partner, you may increase feelings of intimacy, comfort, trust, and honesty between the two of you.   This information is not intended to replace advice given to you by your health care provider. Make sure you discuss any questions you have with your health care provider.   Document Released: 07/01/2004 Document Revised: 06/14/2014 Document Reviewed: 11/15/2011 Elsevier Interactive Patient Education [employer].
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
## 2015-12-23 NOTE — ED Notes (Signed)
PT had oral sex with a female partner in March and once more approximately one month ago. The female partner disclosed to PT that he is HIV positive last week. PT is concerned that she may have contracted HIV during their contact. PT has a rash on right cheek and on her back for two days.

## 2015-12-24 LAB — URINE CYTOLOGY ANCILLARY ONLY
Chlamydia: NEGATIVE
Neisseria Gonorrhea: NEGATIVE
TRICH (WINDOWPATH): NEGATIVE

## 2015-12-24 LAB — HIV ANTIBODY (ROUTINE TESTING W REFLEX): HIV Screen 4th Generation wRfx: NONREACTIVE

## 2015-12-26 ENCOUNTER — Ambulatory Visit: Payer: 59 | Attending: Gynecology | Admitting: Gynecology

## 2015-12-26 ENCOUNTER — Encounter: Payer: Self-pay | Admitting: Gynecology

## 2015-12-26 VITALS — BP 153/98 | HR 57 | Temp 98.9°F | Resp 18 | Ht 63.0 in | Wt 101.2 lb

## 2015-12-26 DIAGNOSIS — Z90721 Acquired absence of ovaries, unilateral: Secondary | ICD-10-CM | POA: Insufficient documentation

## 2015-12-26 DIAGNOSIS — D271 Benign neoplasm of left ovary: Secondary | ICD-10-CM

## 2015-12-26 DIAGNOSIS — R1031 Right lower quadrant pain: Secondary | ICD-10-CM

## 2015-12-26 DIAGNOSIS — M109 Gout, unspecified: Secondary | ICD-10-CM | POA: Insufficient documentation

## 2015-12-26 DIAGNOSIS — R1032 Left lower quadrant pain: Secondary | ICD-10-CM | POA: Insufficient documentation

## 2015-12-26 DIAGNOSIS — Z809 Family history of malignant neoplasm, unspecified: Secondary | ICD-10-CM | POA: Insufficient documentation

## 2015-12-26 DIAGNOSIS — I10 Essential (primary) hypertension: Secondary | ICD-10-CM | POA: Insufficient documentation

## 2015-12-26 DIAGNOSIS — F419 Anxiety disorder, unspecified: Secondary | ICD-10-CM | POA: Insufficient documentation

## 2015-12-26 DIAGNOSIS — Z8249 Family history of ischemic heart disease and other diseases of the circulatory system: Secondary | ICD-10-CM | POA: Insufficient documentation

## 2015-12-26 LAB — URINE CYTOLOGY ANCILLARY ONLY: BACTERIAL VAGINITIS: NEGATIVE
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
## 2015-12-26 NOTE — Patient Instructions (Signed)
Plan to follow up in six months or sooner if needed.  Please call our office at [phone] in September or October to schedule an appointment for Jan 2018.

## 2016-03-02 ENCOUNTER — Observation Stay (HOSPITAL_COMMUNITY)
Admission: EM | Admit: 2016-03-02 | Discharge: 2016-03-04 | Disposition: A | Discharge status: Home / Self Care | Payer: Self-pay | Attending: Nephrology | Admitting: Nephrology

## 2016-03-02 ENCOUNTER — Emergency Department (HOSPITAL_COMMUNITY): Payer: Self-pay

## 2016-03-02 ENCOUNTER — Encounter (HOSPITAL_COMMUNITY): Payer: Self-pay | Admitting: Emergency Medicine

## 2016-03-02 DIAGNOSIS — R079 Chest pain, unspecified: Secondary | ICD-10-CM

## 2016-03-02 DIAGNOSIS — R0789 Other chest pain: Principal | ICD-10-CM | POA: Insufficient documentation

## 2016-03-02 DIAGNOSIS — I10 Essential (primary) hypertension: Secondary | ICD-10-CM | POA: Insufficient documentation

## 2016-03-02 DIAGNOSIS — R001 Bradycardia, unspecified: Secondary | ICD-10-CM | POA: Insufficient documentation

## 2016-03-02 DIAGNOSIS — Z8249 Family history of ischemic heart disease and other diseases of the circulatory system: Secondary | ICD-10-CM | POA: Insufficient documentation

## 2016-03-02 DIAGNOSIS — E876 Hypokalemia: Secondary | ICD-10-CM | POA: Insufficient documentation

## 2016-03-02 LAB — I-STAT TROPONIN, ED
TROPONIN I, POC: 0 ng/mL (ref 0.00–0.08)
TROPONIN I, POC: 0 ng/mL (ref 0.00–0.08)

## 2016-03-02 LAB — CBC
HCT: 34.4 % — ABNORMAL LOW (ref 36.0–46.0)
HEMOGLOBIN: 12.4 g/dL (ref 12.0–15.0)
MCH: 30.8 pg (ref 26.0–34.0)
MCHC: 36 g/dL (ref 30.0–36.0)
MCV: 85.6 fL (ref 78.0–100.0)
PLATELETS: 317 10*3/uL (ref 150–400)
RBC: 4.02 MIL/uL (ref 3.87–5.11)
RDW: 13 % (ref 11.5–15.5)
WBC: 8.8 10*3/uL (ref 4.0–10.5)

## 2016-03-02 LAB — BASIC METABOLIC PANEL
ANION GAP: 11 (ref 5–15)
BUN: 6 mg/dL (ref 6–20)
CALCIUM: 8.9 mg/dL (ref 8.9–10.3)
CO2: 22 mmol/L (ref 22–32)
CREATININE: 0.79 mg/dL (ref 0.44–1.00)
Chloride: 102 mmol/L (ref 101–111)
GFR calc non Af Amer: >60 mL/min (ref >60)
Glucose, Bld: 104 mg/dL — ABNORMAL HIGH (ref 65–99)
Potassium: 3.3 mmol/L — ABNORMAL LOW (ref 3.5–5.1)
SODIUM: 135 mmol/L (ref 135–145)

## 2016-03-02 MED ORDER — NITROGLYCERIN 0.4 MG SL SUBL
0.4000 mg | SUBLINGUAL_TABLET | SUBLINGUAL | Status: DC | PRN
  Administered 2016-03-02: 0.4 mg via SUBLINGUAL
  Filled 2016-03-02: qty 1

## 2016-03-02 MED ORDER — ASPIRIN 81 MG PO CHEW
324.0000 mg | CHEWABLE_TABLET | Freq: Once | ORAL | Status: AC
  Administered 2016-03-02: 324 mg via ORAL
  Filled 2016-03-02: qty 4

## 2016-03-02 MED ORDER — POTASSIUM CHLORIDE CRYS ER 20 MEQ PO TBCR
40.0000 meq | EXTENDED_RELEASE_TABLET | Freq: Once | ORAL | Status: AC
  Administered 2016-03-02: 40 meq via ORAL
  Filled 2016-03-02: qty 2
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
## 2016-03-02 NOTE — ED Triage Notes (Signed)
Patient here from work with complaints of central chest pain radiating down into right abdomen that started 1 hour ago. Denies n/v/d.

## 2016-03-03 ENCOUNTER — Encounter (HOSPITAL_COMMUNITY): Payer: Self-pay | Admitting: Family Medicine

## 2016-03-03 ENCOUNTER — Observation Stay (HOSPITAL_BASED_OUTPATIENT_CLINIC_OR_DEPARTMENT_OTHER): Payer: 59

## 2016-03-03 DIAGNOSIS — I10 Essential (primary) hypertension: Secondary | ICD-10-CM

## 2016-03-03 DIAGNOSIS — R9431 Abnormal electrocardiogram [ECG] [EKG]: Secondary | ICD-10-CM

## 2016-03-03 DIAGNOSIS — E876 Hypokalemia: Secondary | ICD-10-CM

## 2016-03-03 DIAGNOSIS — R001 Bradycardia, unspecified: Secondary | ICD-10-CM

## 2016-03-03 DIAGNOSIS — R079 Chest pain, unspecified: Secondary | ICD-10-CM | POA: Diagnosis present

## 2016-03-03 LAB — BASIC METABOLIC PANEL
ANION GAP: 8 (ref 5–15)
Anion gap: 8 (ref 5–15)
BUN: 6 mg/dL (ref 6–20)
BUN: 6 mg/dL (ref 6–20)
CALCIUM: 9.3 mg/dL (ref 8.9–10.3)
CO2: 26 mmol/L (ref 22–32)
CO2: 25 mmol/L (ref 22–32)
Calcium: 9.2 mg/dL (ref 8.9–10.3)
Chloride: 103 mmol/L (ref 101–111)
Chloride: 105 mmol/L (ref 101–111)
Creatinine, Ser: 0.68 mg/dL (ref 0.44–1.00)
Creatinine, Ser: 0.71 mg/dL (ref 0.44–1.00)
GFR calc Af Amer: >60 mL/min (ref >60)
GLUCOSE: 94 mg/dL (ref 65–99)
GLUCOSE: 96 mg/dL (ref 65–99)
POTASSIUM: 3.5 mmol/L (ref 3.5–5.1)
Potassium: 3.7 mmol/L (ref 3.5–5.1)
SODIUM: 138 mmol/L (ref 135–145)
Sodium: 137 mmol/L (ref 135–145)

## 2016-03-03 LAB — RAPID URINE DRUG SCREEN, HOSP PERFORMED
Amphetamines: NOT DETECTED
BARBITURATES: NOT DETECTED
Benzodiazepines: NOT DETECTED
Cocaine: NOT DETECTED
OPIATES: NOT DETECTED
TETRAHYDROCANNABINOL: POSITIVE — AB

## 2016-03-03 LAB — D-DIMER, QUANTITATIVE (NOT AT ARMC): D DIMER QUANT: 0.38 ug{FEU}/mL (ref 0.00–0.50)

## 2016-03-03 LAB — TROPONIN I
Troponin I: <0.03 ng/mL (ref <0.03)
Troponin I: <0.03 ng/mL (ref <0.03)

## 2016-03-03 LAB — URINALYSIS, ROUTINE W REFLEX MICROSCOPIC
Bilirubin Urine: NEGATIVE
Glucose, UA: NEGATIVE mg/dL
Hgb urine dipstick: NEGATIVE
KETONES UR: NEGATIVE mg/dL
LEUKOCYTES UA: NEGATIVE
NITRITE: NEGATIVE
PH: 7.5 (ref 5.0–8.0)
PROTEIN: NEGATIVE mg/dL
Specific Gravity, Urine: 1.01 (ref 1.005–1.030)

## 2016-03-03 LAB — TSH: TSH: 4.316 u[IU]/mL (ref 0.350–4.500)

## 2016-03-03 LAB — LIPID PANEL
CHOL/HDL RATIO: 2.4 ratio
Cholesterol: 189 mg/dL (ref 0–200)
HDL: 80 mg/dL (ref >40)
LDL Cholesterol: 93 mg/dL (ref 0–99)
Triglycerides: 81 mg/dL (ref <150)
VLDL: 16 mg/dL (ref 0–40)

## 2016-03-03 LAB — MAGNESIUM: MAGNESIUM: 2 mg/dL (ref 1.7–2.4)

## 2016-03-03 LAB — ECHOCARDIOGRAM COMPLETE
Height: 64 in
WEIGHTICAEL: 1746.04 [oz_av]

## 2016-03-03 LAB — BRAIN NATRIURETIC PEPTIDE: B NATRIURETIC PEPTIDE 5: 52.7 pg/mL (ref 0.0–100.0)

## 2016-03-03 LAB — T4, FREE: Free T4: 1.02 ng/dL (ref 0.61–1.12)

## 2016-03-03 MED ORDER — ACETAMINOPHEN 325 MG PO TABS
650.0000 mg | ORAL_TABLET | ORAL | Status: DC | PRN
  Administered 2016-03-03: 650 mg via ORAL
  Filled 2016-03-03: qty 2

## 2016-03-03 MED ORDER — ACETAMINOPHEN 325 MG PO TABS: 650.0000 mg | ORAL_TABLET | Freq: Four times a day (QID) | ORAL | Status: DC | PRN

## 2016-03-03 MED ORDER — NITROGLYCERIN 0.4 MG SL SUBL
0.4000 mg | SUBLINGUAL_TABLET | SUBLINGUAL | Status: DC | PRN
  Administered 2016-03-03 (×2): 0.4 mg via SUBLINGUAL
  Filled 2016-03-03 (×2): qty 1

## 2016-03-03 MED ORDER — HYDRALAZINE HCL 20 MG/ML IJ SOLN: 10.0000 mg | INTRAMUSCULAR | Status: DC | PRN

## 2016-03-03 MED ORDER — ASPIRIN EC 81 MG PO TBEC
81.0000 mg | DELAYED_RELEASE_TABLET | Freq: Every day | ORAL | Status: DC
  Administered 2016-03-04: 81 mg via ORAL
  Filled 2016-03-03 (×3): qty 1

## 2016-03-03 MED ORDER — SODIUM CHLORIDE 0.9% FLUSH: 3.0000 mL | INTRAVENOUS | Status: DC | PRN

## 2016-03-03 MED ORDER — AMLODIPINE BESYLATE 5 MG PO TABS
5.0000 mg | ORAL_TABLET | Freq: Two times a day (BID) | ORAL | Status: DC
  Administered 2016-03-03 – 2016-03-04 (×2): 5 mg via ORAL
  Filled 2016-03-03 (×2): qty 1

## 2016-03-03 MED ORDER — FAMOTIDINE IN NACL 20-0.9 MG/50ML-% IV SOLN
20.0000 mg | Freq: Two times a day (BID) | INTRAVENOUS | Status: DC
  Administered 2016-03-03 – 2016-03-04 (×4): 20 mg via INTRAVENOUS
  Filled 2016-03-03 (×4): qty 50

## 2016-03-03 MED ORDER — LISINOPRIL 10 MG PO TABS
10.0000 mg | ORAL_TABLET | Freq: Every day | ORAL | Status: DC
  Administered 2016-03-04: 10 mg via ORAL
  Filled 2016-03-03: qty 1

## 2016-03-03 MED ORDER — METOPROLOL TARTRATE 25 MG PO TABS: 25.0000 mg | ORAL_TABLET | Freq: Two times a day (BID) | ORAL | Status: DC

## 2016-03-03 MED ORDER — AMLODIPINE BESYLATE 5 MG PO TABS: 5.0000 mg | ORAL_TABLET | Freq: Every day | ORAL | Status: DC

## 2016-03-03 MED ORDER — AMLODIPINE BESYLATE 10 MG PO TABS
10.0000 mg | ORAL_TABLET | Freq: Every day | ORAL | Status: DC
  Administered 2016-03-03: 10 mg via ORAL
  Filled 2016-03-03 (×2): qty 1

## 2016-03-03 MED ORDER — LISINOPRIL 10 MG PO TABS
5.0000 mg | ORAL_TABLET | Freq: Every day | ORAL | Status: DC
  Administered 2016-03-03: 5 mg via ORAL
  Filled 2016-03-03: qty 1

## 2016-03-03 MED ORDER — SODIUM CHLORIDE 0.9 % IV SOLN: 250.0000 mL | INTRAVENOUS | Status: DC | PRN

## 2016-03-03 MED ORDER — ONDANSETRON HCL 4 MG/2ML IJ SOLN: 4.0000 mg | Freq: Four times a day (QID) | INTRAMUSCULAR | Status: DC | PRN

## 2016-03-03 MED ORDER — ALLOPURINOL 300 MG PO TABS
300.0000 mg | ORAL_TABLET | Freq: Every day | ORAL | Status: DC
  Administered 2016-03-03 – 2016-03-04 (×2): 300 mg via ORAL
  Filled 2016-03-03 (×2): qty 1

## 2016-03-03 MED ORDER — ATORVASTATIN CALCIUM 40 MG PO TABS
40.0000 mg | ORAL_TABLET | Freq: Every day | ORAL | Status: DC
  Administered 2016-03-03: 40 mg via ORAL
  Filled 2016-03-03: qty 1

## 2016-03-03 MED ORDER — METHOCARBAMOL 500 MG PO TABS
500.0000 mg | ORAL_TABLET | Freq: Three times a day (TID) | ORAL | Status: DC
  Administered 2016-03-03 – 2016-03-04 (×4): 500 mg via ORAL
  Filled 2016-03-03 (×4): qty 1

## 2016-03-03 MED ORDER — PREMIER PROTEIN SHAKE
11.0000 [oz_av] | Freq: Two times a day (BID) | ORAL | Status: DC
  Administered 2016-03-03 – 2016-03-04 (×2): 11 [oz_av] via ORAL
  Filled 2016-03-03 (×2): qty 325.31

## 2016-03-03 MED ORDER — SODIUM CHLORIDE 0.9% FLUSH
3.0000 mL | Freq: Two times a day (BID) | INTRAVENOUS | Status: DC
  Administered 2016-03-03 – 2016-03-04 (×3): 3 mL via INTRAVENOUS

## 2016-03-03 MED ORDER — ENOXAPARIN SODIUM 40 MG/0.4ML ~~LOC~~ SOLN
40.0000 mg | SUBCUTANEOUS | Status: DC
  Administered 2016-03-03: 40 mg via SUBCUTANEOUS
  Filled 2016-03-03 (×2): qty 0.4

## 2016-03-03 NOTE — Progress Notes (Addendum)
The patient was seen and examined at bedside. Patient was admitted after midnight.Please see H&P from today for detail. Briefly, 65 y/o female with history of gout, HTN, presented with chest pain for 1.5 hours. The pain is atypical, associated with abdomen pain, both arm and back pain. Relieved with NTG. Troponin negative. EKG unremarkable for ischemia, has asymtomatic bradycardia. TSH level acceptable.  Family history of CAD, father died of heart disease in his 21s.  Denied smoking, recent travel or leg pain/swelling.   No chest pain this morning. Denied SOB, palpitation.  D-dimer checked today, not elevated.  HEART score of 3. Given bradycardia, I will check Echo. Added robaxin   Monitor for hypokalemia, telemetry. Continue ASA, lipitor, pepcid.   HTN: BP fluctuating. Continue norvasc and lisinopril. Mother and husband at bedside.   OE : 111-165/79-89, HR 49-81 NAD Resp: CTA b/l, no wheeze or crackle Cvs: RRR, s1s2 nl, no murmur appreciated.  Gi; Bs+, soft, nontender No LE edema, no calves tenderness No chest wall tenderness  DVT ppx: Lovenox sq.

## 2016-03-03 NOTE — Progress Notes (Signed)
Initial Nutrition Assessment  DOCUMENTATION CODES:   Severe malnutrition in context of chronic illness  INTERVENTION:   -Risk manager Protein supplements BID, each provides 160 kcal and 30 g protein. -Provided brief vegetarian diet education  -Recommend Multivitamin with minerals daily -RD to continue to monitor  NUTRITION DIAGNOSIS:   Malnutrition related to chronic illness as evidenced by severe depletion of body fat, severe depletion of muscle mass.  GOAL:   Patient will meet greater than or equal to 90% of their needs  MONITOR:   PO intake, Supplement acceptance, Labs, Weight trends, I & O's  REASON FOR ASSESSMENT:   Malnutrition Screening Tool    ASSESSMENT:   65 y.o. female with medical history significant for hypertension and gout who presents the emergency department with acute onset of chest pain. Patient reports that she was at work today and in her usual state of health when she experienced the sudden onset of a sharp pain in the central chest with radiation to the right arm and down to the right lower quadrant of her abdomen. She described the pain as sharp in character, severe in intensity, associated with diaphoresis, but no nausea or dyspnea, and with no alleviating or exacerbating factors. She reports that the arm and abdominal pain resolved over the course of approximately one hour, but she had persistent discomfort in the anterior chest described as "soreness."   Patient in room with lunch tray at bedside table. Pt ate 1/2 a grilled cheese sandwich and some fruit for lunch, consumed ~25-50%. Pt states she has a good appetite and follows a vegetarian diet which includes fish and milk/dairy products. She has followed this diet since 1975. Pt usually eats beans, cheese and nuts. 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 She takes Vitamin D and C supplements as well as a biotin supplement. Encouraged pt to take a multivitamin supplement  as well. Provided brief vegetarian diet education. Pt is interested in trying a protein supplement, RD to order Premier Protein in between meals.  Pt reports losing a lot of fat mass lately. UBW is 160 lb (unsure of time frame). Per weight history, pt's weight has fluctuated between 101-118 lb since July 2017.  Nutrition-Focused physical exam completed. Findings are severe fat depletion, severe muscle depletion, and no edema.   Labs reviewed. Medications reviewed.  Diet Order:  Diet Heart Room service appropriate? Yes; Fluid consistency: Thin  Skin:  Reviewed, no issues  Last BM:  9/26  Height:   Ht Readings from Last 1 Encounters:  03/03/16 5\' 4"  (1.626 m)    Weight:   Wt Readings from Last 1 Encounters:  03/03/16 109 lb 2 oz (49.5 kg)    Ideal Body Weight:  54.5 kg  BMI:  Body mass index is 18.73 kg/m.  Estimated Nutritional Needs:   Kcal:  [id number]  Protein:  75-85g  Fluid:  1.6L/day  EDUCATION NEEDS:   Education needs addressed (Reviewed vegetarian diet recommendations)  Clayton Bibles, MS, RD, LDN Pager: [phone] After Hours Pager: [phone]

## 2016-03-03 NOTE — ED Notes (Signed)
MD at bedside. 
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
## 2016-03-03 NOTE — Progress Notes (Signed)
*  PRELIMINARY RESULTS* Echocardiogram 2D Echocardiogram has been performed.  Lori [name] 03/03/2016, 2:55 PM

## 2016-03-04 ENCOUNTER — Telehealth: Payer: Self-pay

## 2016-03-04 LAB — BASIC METABOLIC PANEL
ANION GAP: 8 (ref 5–15)
BUN: 12 mg/dL (ref 6–20)
CALCIUM: 9.2 mg/dL (ref 8.9–10.3)
CHLORIDE: 102 mmol/L (ref 101–111)
CO2: 24 mmol/L (ref 22–32)
CREATININE: 0.73 mg/dL (ref 0.44–1.00)
GLUCOSE: 93 mg/dL (ref 65–99)
Potassium: 3.6 mmol/L (ref 3.5–5.1)
Sodium: 134 mmol/L — ABNORMAL LOW (ref 135–145)

## 2016-03-04 MED ORDER — PANTOPRAZOLE SODIUM 40 MG PO TBEC: 40.0000 mg | DELAYED_RELEASE_TABLET | Freq: Every day | ORAL | 0 refills | Status: DC

## 2016-03-04 MED ORDER — AMLODIPINE BESYLATE 5 MG PO TABS: 5.0000 mg | ORAL_TABLET | Freq: Two times a day (BID) | ORAL | 0 refills | Status: DC

## 2016-03-04 MED ORDER — LISINOPRIL 10 MG PO TABS: 10.0000 mg | ORAL_TABLET | Freq: Every day | ORAL | 0 refills | Status: DC

## 2016-03-04 MED ORDER — METHOCARBAMOL 500 MG PO TABS: 500.0000 mg | ORAL_TABLET | Freq: Three times a day (TID) | ORAL | 0 refills | Status: DC | PRN

## 2016-03-04 NOTE — Telephone Encounter (Signed)
Attempted to contact the patient yesterday at 3:15 PM , no answer ( currently admitted to [facility] hospital Room 1408 ) Patient states she just wanted Dr Marti Sleigh to be updated that she was in the hospital for cardiac observation . Informed the patient that Joylene John, APNP was updated yesterday and we will update Dr Fermin Schwab. Patient denies further questions at this time.

## 2016-03-04 NOTE — Care Management Note (Signed)
Case Management Note  Patient Details  Name: Lori [name] MRN: [id number] Date of Birth: [birth year]  Subjective/Objective:   65 y/o f admitted w/Chest pain. From home. No insurance. Has spouse who works, can afford meds. Provided w/health insurance info-voiced understanding. No  Further CM needs.                Action/Plan:d/c home.   Expected Discharge Date:                  Expected Discharge Plan:  Home/Self Care  In-House Referral:     Discharge planning Services  CM Consult  Post Acute Care Choice:    Choice offered to:     DME Arranged:    DME Agency:     HH Arranged:    [place] Agency:     Status of Service:  Completed, signed off  If discussed at [employer] of Stay Meetings, dates discussed:    Additional Comments:  Dessa Phi, RN 03/04/2016, 12:20 PM

## 2016-03-04 NOTE — Discharge Summary (Signed)
Physician Discharge Summary  Lori [name] [id number] DOB: November 09, 1950 DOA: 03/02/2016  PCP: Tamsen Roers, MD  Admit date: 03/02/2016 Discharge date: 03/04/2016  Admitted From:  Disposition:   Recommendations for Outpatient Follow-up:  1. Follow up with PCP in 1-2 weeks   Home Health: Equipment/Devices:  Discharge Condition: CODE STATUS: Diet recommendation:    Brief/Interim Summary:65 year old female with history of gout, hypertension presented with diffuse chest pain for one half hour. The chest pain was on both left and right side associated with abdominal pain, with arm pain and back pain. Patient reported that the pain improved after nitroglycerin. Patient was observed in the hospital. Troponins were negative. EKG unremarkable for any ischemic changes except a sporadic bradycardia. TSH level acceptable. Echocardiogram done which showed date first and diastolic dysfunction. Unable to start a beta blocker because of relative bradycardia in the hospital. Blood pressure medication adjusted for the management of uncontrolled hypertension. I think patient's diffuse chest pain likely musculoskeletal versus asymmetric results related. She was treated with anti-acid and both muscle relaxant. Patient is clinically improved. She said she would follow up with her primary care doctor in 1-2 weeks.  Patient denied recent travel, smoking. Denied leg pain or leg swelling. D-dimer was not elevated. Patient has HEART score of only 3.   Discharge Diagnoses:  Principal Problem:   Chest pain Active Problems:   Essential hypertension, benign   Hypokalemia   Pain in the chest   Bradycardia   Nonspecific chest pain    Discharge Instructions  Discharge Instructions    Call MD for:  difficulty breathing, headache or visual disturbances    Complete by:  As directed    Call MD for:  severe uncontrolled pain    Complete by:  As directed    Call MD for:  temperature >100.4    Complete by:  As  directed    Diet - low sodium heart healthy    Complete by:  As directed    Increase activity slowly    Complete by:  As directed        Medication List    STOP taking these medications   cephALEXin 500 MG capsule Commonly known as:  KEFLEX     TAKE these medications   allopurinol 300 MG tablet Commonly known as:  ZYLOPRIM TAKE ONE DAILY FOR GOUT PREVENTION   amLODipine 5 MG tablet Commonly known as:  NORVASC Take 1 tablet (5 mg total) by mouth 2 (two) times daily. What changed:  See the new instructions.   lisinopril 10 MG tablet Commonly known as:  PRINIVIL,ZESTRIL Take 1 tablet (10 mg total) by mouth daily. What changed:  how much to take   methocarbamol 500 MG tablet Commonly known as:  ROBAXIN Take 1 tablet (500 mg total) by mouth every 8 (eight) hours as needed for muscle spasms.   pantoprazole 40 MG tablet Commonly known as:  PROTONIX Take 1 tablet (40 mg total) by mouth daily.      Follow-up Information    LITTLE,JAMES, MD. Schedule an appointment as soon as possible for a visit in 1 week(s).   Specialty:  Family Medicine Contact information: [id number] [place] [address] [phone]          No Known Allergies    Procedures/Studies: Dg Chest 2 View  Result Date: 03/02/2016 CLINICAL DATA:  Acute onset of generalized chest pain and shortness of breath. Right-sided abdominal pain. Initial encounter. EXAM: CHEST  2 VIEW COMPARISON:  None. FINDINGS: The  lungs are well-aerated and clear. There is no evidence of focal opacification, pleural effusion or pneumothorax. The heart is normal in size; the mediastinal contour is within normal limits. No acute osseous abnormalities are seen. IMPRESSION: No acute cardiopulmonary process seen. Electronically Signed   By: Garald Balding M.D.   On: 03/02/2016 18:17   Echo: Study Conclusions  - Left ventricle: The cavity size was normal. There was mild   concentric hypertrophy. Systolic function was  vigorous. The   estimated ejection fraction was in the range of 65% to 70%. Wall   motion was normal; there were no regional wall motion   abnormalities. Doppler parameters are consistent with abnormal   left ventricular relaxation (grade 1 diastolic dysfunction).   There was no evidence of elevated ventricular filling pressure by   Doppler parameters. - Aortic valve: Trileaflet; normal thickness leaflets. There was no   regurgitation. - Aortic root: The aortic root was normal in size. - Mitral valve: There was no regurgitation. - Left atrium: The atrium was normal in size. - Right ventricle: Systolic function was normal. - Right atrium: The atrium was normal in size. - Tricuspid valve: There was mild regurgitation. - Pulmonary arteries: Systolic pressure was within the normal   range. - Inferior vena cava: The vessel was normal in size. - Pericardium, extracardiac: There was no pericardial effusion.  Subjective: The patient was seen and examined at bedside. Patient denied headache, dizziness, chest pain, shortness of breath, nausea, vomiting,. Denied cough or abdominal pain. Denied anxiety, depression or suicidal ideation. Patient is eager to go home today.   Discharge Exam: Vitals:   03/03/16 2031 03/04/16 0500  BP: (!) 147/93 125/79  Pulse: 67 87  Resp: 16 16  Temp: 98.2 F (36.8 C) 98.2 F (36.8 C)   Vitals:   03/03/16 1111 03/03/16 1414 03/03/16 2031 03/04/16 0500  BP: (!) 165/89 (!) 141/92 (!) 147/93 125/79  Pulse:  78 67 87  Resp:  18 16 16   Temp:  98.5 F (36.9 C) 98.2 F (36.8 C) 98.2 F (36.8 C)  TempSrc:  Oral Oral Oral  SpO2:  99% 96% 98%  Weight:      Height:        General: Pt is alert, awake, not in acute distress Cardiovascular: RRR, S1/S2 +, no rubs, no gallops Respiratory: CTA bilaterally, no wheezing, no rhonchi Abdominal: Soft, NT, ND, bowel sounds + Extremities: no edema, no cyanosis    The results of significant diagnostics from this  hospitalization (including imaging, microbiology, ancillary and laboratory) are listed below for reference.     Microbiology: No results found for this or any previous visit (from the past 240 hour(s)).   Labs: BNP (last 3 results)  Recent Labs  03/03/16 0220  BNP [id number]   Basic Metabolic Panel:  Recent Labs Lab 03/02/16 1721 03/03/16 0220 03/03/16 0542 03/04/16 0524  NA 135 137 138 134*  K 3.3* 3.5 3.7 3.6  CL 102 103 105 102  CO2 22 26 25 24   GLUCOSE 104* 94 96 93  BUN 6 6 6 12   CREATININE 0.79 0.68 0.71 0.73  CALCIUM 8.9 9.3 9.2 9.2  MG  --   --  2.0  --    Liver Function Tests: No results for input(s): AST, ALT, ALKPHOS, BILITOT, PROT, ALBUMIN in the last 168 hours. No results for input(s): LIPASE, AMYLASE in the last 168 hours. No results for input(s): AMMONIA in the last 168 hours. CBC:  Recent Labs Lab 03/02/16  1721  WBC 8.8  HGB 12.4  HCT 34.4*  MCV 85.6  PLT 317   Cardiac Enzymes:  Recent Labs Lab 03/03/16 0220 03/03/16 0840  TROPONINI <0.03 <0.03   BNP: Invalid input(s): POCBNP CBG: No results for input(s): GLUCAP in the last 168 hours. D-Dimer  Recent Labs  03/03/16 0220  DDIMER 0.38   Hgb A1c No results for input(s): HGBA1C in the last 72 hours. Lipid Profile  Recent Labs  03/03/16 0220  CHOL 189  HDL 80  LDLCALC 93  TRIG 81  CHOLHDL 2.4   Thyroid function studies  Recent Labs  03/03/16 0220  TSH 4.316   Anemia work up No results for input(s): VITAMINB12, FOLATE, FERRITIN, TIBC, IRON, RETICCTPCT in the last 72 hours. Urinalysis    Component Value Date/Time   COLORURINE STRAW (A) 03/03/2016 0720   APPEARANCEUR CLEAR 03/03/2016 0720   LABSPEC 1.010 03/03/2016 0720   PHURINE 7.5 03/03/2016 0720   GLUCOSEU NEGATIVE 03/03/2016 0720   HGBUR NEGATIVE 03/03/2016 0720   BILIRUBINUR NEGATIVE 03/03/2016 0720   KETONESUR NEGATIVE 03/03/2016 0720   PROTEINUR NEGATIVE 03/03/2016 0720   UROBILINOGEN 0.2 07/13/2013 0049    NITRITE NEGATIVE 03/03/2016 0720   LEUKOCYTESUR NEGATIVE 03/03/2016 0720   Sepsis Labs Invalid input(s): PROCALCITONIN,  WBC,  LACTICIDVEN Microbiology No results found for this or any previous visit (from the past 240 hour(s)).   Time coordinating discharge: 26 minutes  SIGNED:   Rosita Fire, MD  [facility] 03/04/2016, 11:34 AM Pager   If 7PM-7AM, please contact night-coverage [email or web address] Password [email or web address]

## 2016-06-18 ENCOUNTER — Ambulatory Visit (HOSPITAL_COMMUNITY)
Admission: EM | Admit: 2016-06-18 | Discharge: 2016-06-18 | Disposition: A | Discharge status: Home / Self Care | Payer: 59 | Attending: Family Medicine | Admitting: Family Medicine

## 2016-06-18 ENCOUNTER — Encounter (HOSPITAL_COMMUNITY): Payer: Self-pay

## 2016-06-18 DIAGNOSIS — M109 Gout, unspecified: Secondary | ICD-10-CM

## 2016-06-18 MED ORDER — COLCHICINE 0.6 MG PO TABS: ORAL_TABLET | ORAL | 0 refills | Status: DC

## 2016-06-18 MED ORDER — TRAMADOL-ACETAMINOPHEN 37.5-325 MG PO TABS: 1.0000 | ORAL_TABLET | Freq: Four times a day (QID) | ORAL | 0 refills | Status: DC | PRN

## 2016-06-18 MED ORDER — INDOMETHACIN 25 MG PO CAPS: 25.0000 mg | ORAL_CAPSULE | Freq: Three times a day (TID) | ORAL | 0 refills | Status: DC | PRN
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
## 2016-06-18 NOTE — ED Triage Notes (Signed)
Right foot pain. Has a hx of gout. Throbbing pain, burning, cannot stand on it. Has been taking her gout medication daily. No fever. Hot to the touch, red and swollen. Has been hurting since yesterday

## 2016-06-18 NOTE — Discharge Instructions (Signed)
Take the medications as directed. Take with food. Only take the minimum amount of pain medicine as needed. If there is worsening over the weekend, increased redness or red streaks up the leg, developed fevers or other worsening or new problems seek your primary care provider or return or go to the emergency department.

## 2016-09-23 ENCOUNTER — Emergency Department (HOSPITAL_COMMUNITY)
Admission: EM | Admit: 2016-09-23 | Discharge: 2016-09-23 | Disposition: A | Discharge status: Home / Self Care | Payer: Self-pay | Attending: Emergency Medicine | Admitting: Emergency Medicine

## 2016-09-23 ENCOUNTER — Emergency Department (HOSPITAL_COMMUNITY): Payer: Self-pay

## 2016-09-23 ENCOUNTER — Encounter (HOSPITAL_COMMUNITY): Payer: Self-pay | Admitting: Emergency Medicine

## 2016-09-23 DIAGNOSIS — R1010 Upper abdominal pain, unspecified: Secondary | ICD-10-CM

## 2016-09-23 DIAGNOSIS — R1011 Right upper quadrant pain: Secondary | ICD-10-CM | POA: Insufficient documentation

## 2016-09-23 DIAGNOSIS — I10 Essential (primary) hypertension: Secondary | ICD-10-CM | POA: Insufficient documentation

## 2016-09-23 DIAGNOSIS — Z79899 Other long term (current) drug therapy: Secondary | ICD-10-CM | POA: Insufficient documentation

## 2016-09-23 LAB — CBC
HCT: 34.9 % — ABNORMAL LOW (ref 36.0–46.0)
HEMOGLOBIN: 12.1 g/dL (ref 12.0–15.0)
MCH: 31.1 pg (ref 26.0–34.0)
MCHC: 34.7 g/dL (ref 30.0–36.0)
MCV: 89.7 fL (ref 78.0–100.0)
PLATELETS: 320 10*3/uL (ref 150–400)
RBC: 3.89 MIL/uL (ref 3.87–5.11)
RDW: 13.3 % (ref 11.5–15.5)
WBC: 15.2 10*3/uL — ABNORMAL HIGH (ref 4.0–10.5)

## 2016-09-23 LAB — COMPREHENSIVE METABOLIC PANEL
ALT: 11 U/L — AB (ref 14–54)
AST: 20 U/L (ref 15–41)
Albumin: 4 g/dL (ref 3.5–5.0)
Alkaline Phosphatase: 84 U/L (ref 38–126)
Anion gap: 11 (ref 5–15)
BUN: <5 mg/dL — ABNORMAL LOW (ref 6–20)
CHLORIDE: 94 mmol/L — AB (ref 101–111)
CO2: 25 mmol/L (ref 22–32)
CREATININE: 0.79 mg/dL (ref 0.44–1.00)
Calcium: 9 mg/dL (ref 8.9–10.3)
GFR calc non Af Amer: >60 mL/min (ref >60)
Glucose, Bld: 129 mg/dL — ABNORMAL HIGH (ref 65–99)
POTASSIUM: 3.2 mmol/L — AB (ref 3.5–5.1)
SODIUM: 130 mmol/L — AB (ref 135–145)
Total Bilirubin: 0.5 mg/dL (ref 0.3–1.2)
Total Protein: 7.3 g/dL (ref 6.5–8.1)

## 2016-09-23 LAB — URINALYSIS, ROUTINE W REFLEX MICROSCOPIC
BILIRUBIN URINE: NEGATIVE
Glucose, UA: NEGATIVE mg/dL
KETONES UR: 15 mg/dL — AB
Nitrite: NEGATIVE
PROTEIN: NEGATIVE mg/dL
Specific Gravity, Urine: 1.01 (ref 1.005–1.030)
pH: 5.5 (ref 5.0–8.0)

## 2016-09-23 LAB — URINALYSIS, MICROSCOPIC (REFLEX)

## 2016-09-23 LAB — LIPASE, BLOOD: LIPASE: 45 U/L (ref 11–51)

## 2016-09-23 MED ORDER — HYDROCODONE-ACETAMINOPHEN 5-325 MG PO TABS: 1.0000 | ORAL_TABLET | Freq: Four times a day (QID) | ORAL | 0 refills | Status: DC | PRN

## 2016-09-23 MED ORDER — ONDANSETRON HCL 4 MG/2ML IJ SOLN
4.0000 mg | Freq: Once | INTRAMUSCULAR | Status: AC
  Administered 2016-09-23: 4 mg via INTRAVENOUS
  Filled 2016-09-23: qty 2

## 2016-09-23 MED ORDER — KETOROLAC TROMETHAMINE 30 MG/ML IJ SOLN
30.0000 mg | Freq: Once | INTRAMUSCULAR | Status: AC
  Administered 2016-09-23: 30 mg via INTRAVENOUS
  Filled 2016-09-23: qty 1

## 2016-09-23 MED ORDER — HYDROMORPHONE HCL 1 MG/ML IJ SOLN
1.0000 mg | Freq: Once | INTRAMUSCULAR | Status: AC
  Administered 2016-09-23: 1 mg via INTRAVENOUS
  Filled 2016-09-23: qty 1

## 2016-09-23 MED ORDER — IOPAMIDOL (ISOVUE-300) INJECTION 61%
INTRAVENOUS | Status: AC
  Administered 2016-09-23: 100 mL
  Filled 2016-09-23: qty 100

## 2016-09-23 MED ORDER — OXYCODONE-ACETAMINOPHEN 5-325 MG PO TABS
1.0000 | ORAL_TABLET | Freq: Once | ORAL | Status: AC
  Administered 2016-09-23: 1 via ORAL
  Filled 2016-09-23: qty 1

## 2016-09-23 MED ORDER — POLYETHYLENE GLYCOL 3350 17 GM/SCOOP PO POWD: 17.0000 g | Freq: Every day | ORAL | 0 refills | Status: DC

## 2016-09-23 NOTE — ED Notes (Signed)
ED Provider at bedside. 

## 2016-09-23 NOTE — ED Triage Notes (Signed)
Pt sts severe right sided abd pain starting yesterday and worse today; pt sts hx of CA

## 2016-09-23 NOTE — ED Notes (Signed)
Patient Alert and oriented X4. Stable and ambulatory. Patient verbalized understanding of the discharge instructions.  Patient belongings were taken by the patient.  

## 2016-09-23 NOTE — ED Notes (Signed)
Patient transported to Ultrasound 
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
## 2016-09-24 ENCOUNTER — Encounter (HOSPITAL_COMMUNITY): Payer: Self-pay

## 2016-09-24 ENCOUNTER — Emergency Department (HOSPITAL_COMMUNITY)
Admission: EM | Admit: 2016-09-24 | Discharge: 2016-09-24 | Disposition: A | Discharge status: Home / Self Care | Payer: 59 | Attending: Emergency Medicine | Admitting: Emergency Medicine

## 2016-09-24 DIAGNOSIS — I10 Essential (primary) hypertension: Secondary | ICD-10-CM | POA: Insufficient documentation

## 2016-09-24 DIAGNOSIS — Z79899 Other long term (current) drug therapy: Secondary | ICD-10-CM | POA: Insufficient documentation

## 2016-09-24 DIAGNOSIS — R1011 Right upper quadrant pain: Secondary | ICD-10-CM | POA: Insufficient documentation

## 2016-09-24 DIAGNOSIS — R1010 Upper abdominal pain, unspecified: Secondary | ICD-10-CM

## 2016-09-24 LAB — COMPREHENSIVE METABOLIC PANEL
ALT: 8 U/L — ABNORMAL LOW (ref 14–54)
ANION GAP: 7 (ref 5–15)
AST: 14 U/L — ABNORMAL LOW (ref 15–41)
Albumin: 3.3 g/dL — ABNORMAL LOW (ref 3.5–5.0)
Alkaline Phosphatase: 65 U/L (ref 38–126)
BUN: 10 mg/dL (ref 6–20)
CALCIUM: 7.9 mg/dL — AB (ref 8.9–10.3)
CHLORIDE: 98 mmol/L — AB (ref 101–111)
CO2: 24 mmol/L (ref 22–32)
Creatinine, Ser: 0.77 mg/dL (ref 0.44–1.00)
GFR calc non Af Amer: >60 mL/min (ref >60)
Glucose, Bld: 92 mg/dL (ref 65–99)
POTASSIUM: 2.7 mmol/L — AB (ref 3.5–5.1)
SODIUM: 129 mmol/L — AB (ref 135–145)
Total Bilirubin: 0.2 mg/dL — ABNORMAL LOW (ref 0.3–1.2)
Total Protein: 6.3 g/dL — ABNORMAL LOW (ref 6.5–8.1)

## 2016-09-24 LAB — CBC WITH DIFFERENTIAL/PLATELET
Basophils Absolute: 0 10*3/uL (ref 0.0–0.1)
Basophils Relative: 0 %
EOS ABS: 0.2 10*3/uL (ref 0.0–0.7)
Eosinophils Relative: 2 %
HCT: 29 % — ABNORMAL LOW (ref 36.0–46.0)
Hemoglobin: 10.1 g/dL — ABNORMAL LOW (ref 12.0–15.0)
LYMPHS ABS: 3.4 10*3/uL (ref 0.7–4.0)
Lymphocytes Relative: 35 %
MCH: 31.6 pg (ref 26.0–34.0)
MCHC: 34.8 g/dL (ref 30.0–36.0)
MCV: 90.6 fL (ref 78.0–100.0)
MONOS PCT: 9 %
Monocytes Absolute: 0.9 10*3/uL (ref 0.1–1.0)
Neutro Abs: 5.3 10*3/uL (ref 1.7–7.7)
Neutrophils Relative %: 54 %
PLATELETS: 287 10*3/uL (ref 150–400)
RBC: 3.2 MIL/uL — ABNORMAL LOW (ref 3.87–5.11)
RDW: 13 % (ref 11.5–15.5)
WBC: 9.8 10*3/uL (ref 4.0–10.5)

## 2016-09-24 LAB — LIPASE, BLOOD: Lipase: 53 U/L — ABNORMAL HIGH (ref 11–51)

## 2016-09-24 MED ORDER — SODIUM CHLORIDE 0.9 % IV BOLUS (SEPSIS)
1000.0000 mL | Freq: Once | INTRAVENOUS | Status: AC
  Administered 2016-09-24: 1000 mL via INTRAVENOUS

## 2016-09-24 MED ORDER — HYDROMORPHONE HCL 2 MG/ML IJ SOLN
1.0000 mg | Freq: Once | INTRAMUSCULAR | Status: AC
  Administered 2016-09-24: 1 mg via INTRAVENOUS
  Filled 2016-09-24: qty 1

## 2016-09-24 MED ORDER — ONDANSETRON HCL 4 MG/2ML IJ SOLN
4.0000 mg | Freq: Once | INTRAMUSCULAR | Status: AC
  Administered 2016-09-24: 4 mg via INTRAVENOUS
  Filled 2016-09-24: qty 2

## 2016-09-24 MED ORDER — POTASSIUM CHLORIDE CRYS ER 20 MEQ PO TBCR: 20.0000 meq | EXTENDED_RELEASE_TABLET | Freq: Two times a day (BID) | ORAL | 0 refills | Status: DC

## 2016-09-24 NOTE — ED Notes (Signed)
RN GETTING LABS
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
## 2016-09-24 NOTE — ED Triage Notes (Signed)
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  Pt went to [facility] yesterday for rt sided abdominal pain.  Given meds and d/c. Pain uncontrolled.  No current cancer treatments.  Mass was benign.  Told to come back if not controlled.

## 2016-10-05 ENCOUNTER — Other Ambulatory Visit: Payer: Self-pay | Admitting: Gastroenterology

## 2016-10-05 DIAGNOSIS — R1011 Right upper quadrant pain: Secondary | ICD-10-CM

## 2016-10-05 DIAGNOSIS — R748 Abnormal levels of other serum enzymes: Secondary | ICD-10-CM

## 2016-10-06 ENCOUNTER — Telehealth: Payer: Self-pay | Admitting: Gynecologic Oncology

## 2016-10-06 NOTE — Telephone Encounter (Signed)
Spoke with patient.  Patient stating she will be having more testing with Dr. Alessandra Bevels.  She states her pancreatic enzymes are increasing.  Told to continue care with Dr Alessandra Bevels.  Asking what is she going to do about the pain.  She states Dr. Alessandra Bevels would not give her pain medication.  Advised to continue care with Dr. Alessandra Bevels and if she continues to have pain she could see her PCP.  Advised patient that we would not be prescribing pain medication since this is not a GYN related issue and we are not leading the workup on her pancreatic enzyme elevation.  Advised to incorporate a heating pad.  She has been taking aspirin and advised to use caution.  Advised that she could take ibuprofen or tylenol as well in place of aspirin.  Discussed constipation as well with the patient.  She is advised to keep out office posted on any issues related to GYN issues and to call for any questions or concerns.

## 2016-10-15 ENCOUNTER — Ambulatory Visit
Admission: RE | Admit: 2016-10-15 | Discharge: 2016-10-15 | Disposition: A | Discharge status: Home / Self Care | Payer: No Typology Code available for payment source | Source: Ambulatory Visit | Attending: Gastroenterology | Admitting: Gastroenterology

## 2016-10-15 DIAGNOSIS — R748 Abnormal levels of other serum enzymes: Secondary | ICD-10-CM

## 2016-10-15 DIAGNOSIS — R1011 Right upper quadrant pain: Secondary | ICD-10-CM

## 2016-10-15 MED ORDER — GADOBENATE DIMEGLUMINE 529 MG/ML IV SOLN
10.0000 mL | Freq: Once | INTRAVENOUS | Status: AC | PRN
  Administered 2016-10-15: 10 mL via INTRAVENOUS

## 2017-01-20 ENCOUNTER — Emergency Department (HOSPITAL_COMMUNITY)
Admission: EM | Admit: 2017-01-20 | Discharge: 2017-01-20 | Disposition: A | Discharge status: Home / Self Care | Payer: Self-pay | Attending: Emergency Medicine | Admitting: Emergency Medicine

## 2017-01-20 ENCOUNTER — Encounter (HOSPITAL_COMMUNITY): Payer: Self-pay | Admitting: Emergency Medicine

## 2017-01-20 ENCOUNTER — Emergency Department (HOSPITAL_COMMUNITY): Payer: Self-pay

## 2017-01-20 DIAGNOSIS — R079 Chest pain, unspecified: Secondary | ICD-10-CM

## 2017-01-20 DIAGNOSIS — R0789 Other chest pain: Secondary | ICD-10-CM | POA: Insufficient documentation

## 2017-01-20 DIAGNOSIS — I10 Essential (primary) hypertension: Secondary | ICD-10-CM | POA: Insufficient documentation

## 2017-01-20 DIAGNOSIS — Z79899 Other long term (current) drug therapy: Secondary | ICD-10-CM | POA: Insufficient documentation

## 2017-01-20 DIAGNOSIS — Z7982 Long term (current) use of aspirin: Secondary | ICD-10-CM | POA: Insufficient documentation

## 2017-01-20 LAB — CBC
HCT: 37.6 % (ref 36.0–46.0)
Hemoglobin: 12.9 g/dL (ref 12.0–15.0)
MCH: 29.9 pg (ref 26.0–34.0)
MCHC: 34.3 g/dL (ref 30.0–36.0)
MCV: 87 fL (ref 78.0–100.0)
Platelets: 359 10*3/uL (ref 150–400)
RBC: 4.32 MIL/uL (ref 3.87–5.11)
RDW: 13.2 % (ref 11.5–15.5)
WBC: 7.9 10*3/uL (ref 4.0–10.5)

## 2017-01-20 LAB — POCT I-STAT TROPONIN I: Troponin i, poc: 0 ng/mL (ref 0.00–0.08)

## 2017-01-20 LAB — BASIC METABOLIC PANEL
Anion gap: 9 (ref 5–15)
BUN: 5 mg/dL — ABNORMAL LOW (ref 6–20)
CO2: 27 mmol/L (ref 22–32)
Calcium: 9.2 mg/dL (ref 8.9–10.3)
Chloride: 101 mmol/L (ref 101–111)
Creatinine, Ser: 0.73 mg/dL (ref 0.44–1.00)
GFR calc Af Amer: >60 mL/min (ref >60)
GFR calc non Af Amer: >60 mL/min (ref >60)
Glucose, Bld: 89 mg/dL (ref 65–99)
Potassium: 3.7 mmol/L (ref 3.5–5.1)
Sodium: 137 mmol/L (ref 135–145)

## 2017-01-20 NOTE — ED Notes (Signed)
Unable to collect labs at this time patient is in xray 
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
## 2017-01-20 NOTE — ED Triage Notes (Signed)
Patient here from home with complaints of chest pain that started 2 days ago. Constant shooting left sided pain. Reports that pain started after working out in yard cutting trees. Nausea, no vomiting.

## 2017-03-21 ENCOUNTER — Other Ambulatory Visit: Payer: Self-pay | Admitting: Surgery

## 2017-03-21 DIAGNOSIS — K824 Cholesterolosis of gallbladder: Secondary | ICD-10-CM

## 2017-04-28 ENCOUNTER — Emergency Department (HOSPITAL_COMMUNITY)
Admission: EM | Admit: 2017-04-28 | Discharge: 2017-04-28 | Disposition: A | Discharge status: Home / Self Care | Payer: Medicare Other | Attending: Emergency Medicine | Admitting: Emergency Medicine

## 2017-04-28 ENCOUNTER — Emergency Department (HOSPITAL_COMMUNITY): Payer: Medicare Other

## 2017-04-28 ENCOUNTER — Encounter (HOSPITAL_COMMUNITY): Payer: Self-pay | Admitting: Emergency Medicine

## 2017-04-28 DIAGNOSIS — Z79899 Other long term (current) drug therapy: Secondary | ICD-10-CM | POA: Insufficient documentation

## 2017-04-28 DIAGNOSIS — M79641 Pain in right hand: Secondary | ICD-10-CM

## 2017-04-28 DIAGNOSIS — M19041 Primary osteoarthritis, right hand: Secondary | ICD-10-CM | POA: Insufficient documentation

## 2017-04-28 DIAGNOSIS — I10 Essential (primary) hypertension: Secondary | ICD-10-CM | POA: Insufficient documentation

## 2017-04-28 DIAGNOSIS — M1990 Unspecified osteoarthritis, unspecified site: Secondary | ICD-10-CM

## 2017-04-28 DIAGNOSIS — R2232 Localized swelling, mass and lump, left upper limb: Secondary | ICD-10-CM | POA: Insufficient documentation

## 2017-04-28 MED ORDER — HYDROCODONE-ACETAMINOPHEN 5-325 MG PO TABS: 2.0000 | ORAL_TABLET | ORAL | 0 refills | Status: DC | PRN

## 2017-04-28 MED ORDER — IBUPROFEN 200 MG PO TABS
600.0000 mg | ORAL_TABLET | Freq: Once | ORAL | Status: AC
  Administered 2017-04-28: 600 mg via ORAL
  Filled 2017-04-28: qty 3

## 2017-04-28 MED ORDER — HYDROCODONE-ACETAMINOPHEN 5-325 MG PO TABS
1.0000 | ORAL_TABLET | Freq: Once | ORAL | Status: AC
  Administered 2017-04-28: 1 via ORAL
  Filled 2017-04-28: qty 1

## 2017-04-28 MED ORDER — PREDNISONE 20 MG PO TABS: 40.0000 mg | ORAL_TABLET | Freq: Every day | ORAL | 0 refills | Status: DC

## 2017-04-28 NOTE — ED Triage Notes (Signed)
Pt reports she woke up with pain and swelling in R hand and elbow since this am. Hx of gout, feels like same.

## 2017-04-28 NOTE — ED Notes (Signed)
Patient transported to X-ray 

## 2017-04-28 NOTE — ED Notes (Signed)
Patient is aware of blood pressure. States she will take medications as soon as she gets home. This is not abnormal for her per patient when she hasn't taken medication.
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
## 2017-04-28 NOTE — Discharge Instructions (Signed)
Take 1 Aleve 2 times a day in addition to the prednisone for swelling and inflammation.  Wear the brace as needed for pain control.  Try to elevate the hand to help with swelling and pain.

## 2018-04-24 DIAGNOSIS — M10031 Idiopathic gout, right wrist: Secondary | ICD-10-CM | POA: Diagnosis not present

## 2018-06-23 ENCOUNTER — Emergency Department (HOSPITAL_COMMUNITY)
Admission: EM | Admit: 2018-06-23 | Discharge: 2018-06-23 | Disposition: A | Discharge status: Home / Self Care | Payer: PPO | Attending: Emergency Medicine | Admitting: Emergency Medicine

## 2018-06-23 ENCOUNTER — Encounter (HOSPITAL_COMMUNITY): Payer: Self-pay | Admitting: Emergency Medicine

## 2018-06-23 ENCOUNTER — Emergency Department (HOSPITAL_COMMUNITY): Payer: PPO

## 2018-06-23 DIAGNOSIS — S3992XA Unspecified injury of lower back, initial encounter: Secondary | ICD-10-CM | POA: Diagnosis not present

## 2018-06-23 DIAGNOSIS — Y939 Activity, unspecified: Secondary | ICD-10-CM | POA: Diagnosis not present

## 2018-06-23 DIAGNOSIS — M542 Cervicalgia: Secondary | ICD-10-CM | POA: Diagnosis not present

## 2018-06-23 DIAGNOSIS — Y999 Unspecified external cause status: Secondary | ICD-10-CM | POA: Insufficient documentation

## 2018-06-23 DIAGNOSIS — Z79899 Other long term (current) drug therapy: Secondary | ICD-10-CM | POA: Insufficient documentation

## 2018-06-23 DIAGNOSIS — Y92410 Unspecified street and highway as the place of occurrence of the external cause: Secondary | ICD-10-CM | POA: Insufficient documentation

## 2018-06-23 DIAGNOSIS — S39012A Strain of muscle, fascia and tendon of lower back, initial encounter: Secondary | ICD-10-CM

## 2018-06-23 DIAGNOSIS — S199XXA Unspecified injury of neck, initial encounter: Secondary | ICD-10-CM | POA: Diagnosis not present

## 2018-06-23 DIAGNOSIS — I10 Essential (primary) hypertension: Secondary | ICD-10-CM | POA: Diagnosis not present

## 2018-06-23 DIAGNOSIS — R51 Headache: Secondary | ICD-10-CM | POA: Diagnosis not present

## 2018-06-23 DIAGNOSIS — M545 Low back pain: Secondary | ICD-10-CM | POA: Diagnosis not present

## 2018-06-23 DIAGNOSIS — S161XXA Strain of muscle, fascia and tendon at neck level, initial encounter: Secondary | ICD-10-CM

## 2018-06-23 DIAGNOSIS — S0990XA Unspecified injury of head, initial encounter: Secondary | ICD-10-CM | POA: Diagnosis not present

## 2018-06-23 LAB — I-STAT TROPONIN, ED: Troponin i, poc: 0.02 ng/mL (ref 0.00–0.08)

## 2018-06-23 LAB — CBC WITH DIFFERENTIAL/PLATELET
Abs Immature Granulocytes: 0.01 10*3/uL (ref 0.00–0.07)
BASOS ABS: 0.1 10*3/uL (ref 0.0–0.1)
Basophils Relative: 1 %
EOS PCT: 4 %
Eosinophils Absolute: 0.3 10*3/uL (ref 0.0–0.5)
HEMATOCRIT: 38.3 % (ref 36.0–46.0)
HEMOGLOBIN: 12.6 g/dL (ref 12.0–15.0)
IMMATURE GRANULOCYTES: 0 %
LYMPHS ABS: 2.7 10*3/uL (ref 0.7–4.0)
LYMPHS PCT: 39 %
MCH: 29.4 pg (ref 26.0–34.0)
MCHC: 32.9 g/dL (ref 30.0–36.0)
MCV: 89.5 fL (ref 80.0–100.0)
MONOS PCT: 8 %
Monocytes Absolute: 0.5 10*3/uL (ref 0.1–1.0)
NRBC: 0 % (ref 0.0–0.2)
Neutro Abs: 3.4 10*3/uL (ref 1.7–7.7)
Neutrophils Relative %: 48 %
Platelets: 442 10*3/uL — ABNORMAL HIGH (ref 150–400)
RBC: 4.28 MIL/uL (ref 3.87–5.11)
RDW: 12.6 % (ref 11.5–15.5)
WBC: 7 10*3/uL (ref 4.0–10.5)

## 2018-06-23 LAB — COMPREHENSIVE METABOLIC PANEL
ALBUMIN: 4.1 g/dL (ref 3.5–5.0)
ALK PHOS: 89 U/L (ref 38–126)
ALT: 12 U/L (ref 0–44)
AST: 10 U/L — AB (ref 15–41)
Anion gap: 10 (ref 5–15)
BUN: 8 mg/dL (ref 8–23)
CHLORIDE: 99 mmol/L (ref 98–111)
CO2: 27 mmol/L (ref 22–32)
CREATININE: 0.7 mg/dL (ref 0.44–1.00)
Calcium: 9 mg/dL (ref 8.9–10.3)
GFR calc non Af Amer: >60 mL/min (ref >60)
Glucose, Bld: 94 mg/dL (ref 70–99)
Potassium: 3.2 mmol/L — ABNORMAL LOW (ref 3.5–5.1)
Sodium: 136 mmol/L (ref 135–145)
Total Bilirubin: 0.5 mg/dL (ref 0.3–1.2)
Total Protein: 8.1 g/dL (ref 6.5–8.1)

## 2018-06-23 MED ORDER — HYDROCODONE-ACETAMINOPHEN 5-325 MG PO TABS
1.0000 | ORAL_TABLET | Freq: Once | ORAL | Status: AC
  Administered 2018-06-23: 1 via ORAL
  Filled 2018-06-23: qty 1

## 2018-06-23 MED ORDER — TIZANIDINE HCL 2 MG PO TABS: 2.0000 mg | ORAL_TABLET | Freq: Three times a day (TID) | ORAL | 0 refills | Status: DC | PRN

## 2018-06-23 MED ORDER — CYCLOBENZAPRINE HCL 10 MG PO TABS
5.0000 mg | ORAL_TABLET | Freq: Once | ORAL | Status: AC
  Administered 2018-06-23: 5 mg via ORAL
  Filled 2018-06-23: qty 1

## 2018-06-23 MED ORDER — METOCLOPRAMIDE HCL 5 MG/ML IJ SOLN
10.0000 mg | Freq: Once | INTRAMUSCULAR | Status: AC
  Administered 2018-06-23: 10 mg via INTRAVENOUS
  Filled 2018-06-23: qty 2

## 2018-06-23 MED ORDER — IBUPROFEN 600 MG PO TABS: 600.0000 mg | ORAL_TABLET | Freq: Three times a day (TID) | ORAL | 0 refills | Status: DC | PRN

## 2018-06-23 MED ORDER — SODIUM CHLORIDE 0.9 % IV BOLUS
500.0000 mL | Freq: Once | INTRAVENOUS | Status: AC
  Administered 2018-06-23: 500 mL via INTRAVENOUS

## 2018-06-23 NOTE — ED Notes (Signed)
Patient transported to CT/XR ?
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
## 2018-06-27 DIAGNOSIS — M791 Myalgia, unspecified site: Secondary | ICD-10-CM | POA: Diagnosis not present

## 2018-06-27 DIAGNOSIS — Z1389 Encounter for screening for other disorder: Secondary | ICD-10-CM | POA: Diagnosis not present

## 2018-06-27 DIAGNOSIS — Z043 Encounter for examination and observation following other accident: Secondary | ICD-10-CM | POA: Diagnosis not present

## 2018-06-27 DIAGNOSIS — R110 Nausea: Secondary | ICD-10-CM | POA: Diagnosis not present

## 2018-06-27 DIAGNOSIS — R0781 Pleurodynia: Secondary | ICD-10-CM | POA: Diagnosis not present

## 2018-06-27 DIAGNOSIS — R51 Headache: Secondary | ICD-10-CM | POA: Diagnosis not present

## 2018-06-27 DIAGNOSIS — H9313 Tinnitus, bilateral: Secondary | ICD-10-CM | POA: Diagnosis not present

## 2018-06-27 DIAGNOSIS — M542 Cervicalgia: Secondary | ICD-10-CM | POA: Diagnosis not present

## 2018-08-01 DIAGNOSIS — M2560 Stiffness of unspecified joint, not elsewhere classified: Secondary | ICD-10-CM | POA: Diagnosis not present

## 2018-08-01 DIAGNOSIS — M542 Cervicalgia: Secondary | ICD-10-CM | POA: Diagnosis not present

## 2018-08-01 DIAGNOSIS — M545 Low back pain: Secondary | ICD-10-CM | POA: Diagnosis not present

## 2018-08-01 DIAGNOSIS — M546 Pain in thoracic spine: Secondary | ICD-10-CM | POA: Diagnosis not present

## 2018-08-02 DIAGNOSIS — H8113 Benign paroxysmal vertigo, bilateral: Secondary | ICD-10-CM | POA: Diagnosis not present

## 2018-08-02 DIAGNOSIS — H9311 Tinnitus, right ear: Secondary | ICD-10-CM | POA: Diagnosis not present

## 2018-08-03 DIAGNOSIS — M546 Pain in thoracic spine: Secondary | ICD-10-CM | POA: Diagnosis not present

## 2018-08-03 DIAGNOSIS — M2560 Stiffness of unspecified joint, not elsewhere classified: Secondary | ICD-10-CM | POA: Diagnosis not present

## 2018-08-03 DIAGNOSIS — M542 Cervicalgia: Secondary | ICD-10-CM | POA: Diagnosis not present

## 2018-08-03 DIAGNOSIS — M545 Low back pain: Secondary | ICD-10-CM | POA: Diagnosis not present

## 2018-08-08 DIAGNOSIS — M542 Cervicalgia: Secondary | ICD-10-CM | POA: Diagnosis not present

## 2018-08-08 DIAGNOSIS — M546 Pain in thoracic spine: Secondary | ICD-10-CM | POA: Diagnosis not present

## 2018-08-08 DIAGNOSIS — M2560 Stiffness of unspecified joint, not elsewhere classified: Secondary | ICD-10-CM | POA: Diagnosis not present

## 2018-08-08 DIAGNOSIS — M545 Low back pain: Secondary | ICD-10-CM | POA: Diagnosis not present

## 2018-10-16 DIAGNOSIS — M62838 Other muscle spasm: Secondary | ICD-10-CM | POA: Diagnosis not present

## 2018-10-16 DIAGNOSIS — M542 Cervicalgia: Secondary | ICD-10-CM | POA: Diagnosis not present

## 2018-12-26 DIAGNOSIS — M50322 Other cervical disc degeneration at C5-C6 level: Secondary | ICD-10-CM | POA: Diagnosis not present

## 2018-12-26 DIAGNOSIS — M50323 Other cervical disc degeneration at C6-C7 level: Secondary | ICD-10-CM | POA: Diagnosis not present

## 2018-12-26 DIAGNOSIS — M9903 Segmental and somatic dysfunction of lumbar region: Secondary | ICD-10-CM | POA: Diagnosis not present

## 2018-12-26 DIAGNOSIS — M5136 Other intervertebral disc degeneration, lumbar region: Secondary | ICD-10-CM | POA: Diagnosis not present

## 2018-12-26 DIAGNOSIS — M545 Low back pain: Secondary | ICD-10-CM | POA: Diagnosis not present

## 2018-12-26 DIAGNOSIS — M542 Cervicalgia: Secondary | ICD-10-CM | POA: Diagnosis not present

## 2018-12-26 DIAGNOSIS — M9902 Segmental and somatic dysfunction of thoracic region: Secondary | ICD-10-CM | POA: Diagnosis not present

## 2018-12-26 DIAGNOSIS — M9901 Segmental and somatic dysfunction of cervical region: Secondary | ICD-10-CM | POA: Diagnosis not present

## 2018-12-26 DIAGNOSIS — M50321 Other cervical disc degeneration at C4-C5 level: Secondary | ICD-10-CM | POA: Diagnosis not present

## 2019-01-16 DIAGNOSIS — M9901 Segmental and somatic dysfunction of cervical region: Secondary | ICD-10-CM | POA: Diagnosis not present

## 2019-01-16 DIAGNOSIS — M9903 Segmental and somatic dysfunction of lumbar region: Secondary | ICD-10-CM | POA: Diagnosis not present

## 2019-01-16 DIAGNOSIS — M9902 Segmental and somatic dysfunction of thoracic region: Secondary | ICD-10-CM | POA: Diagnosis not present

## 2019-01-16 DIAGNOSIS — M50322 Other cervical disc degeneration at C5-C6 level: Secondary | ICD-10-CM | POA: Diagnosis not present

## 2019-01-16 DIAGNOSIS — M50323 Other cervical disc degeneration at C6-C7 level: Secondary | ICD-10-CM | POA: Diagnosis not present

## 2019-01-16 DIAGNOSIS — M50321 Other cervical disc degeneration at C4-C5 level: Secondary | ICD-10-CM | POA: Diagnosis not present

## 2019-01-16 DIAGNOSIS — M545 Low back pain: Secondary | ICD-10-CM | POA: Diagnosis not present

## 2019-01-16 DIAGNOSIS — M542 Cervicalgia: Secondary | ICD-10-CM | POA: Diagnosis not present

## 2019-01-16 DIAGNOSIS — M5136 Other intervertebral disc degeneration, lumbar region: Secondary | ICD-10-CM | POA: Diagnosis not present

## 2019-01-23 DIAGNOSIS — M9901 Segmental and somatic dysfunction of cervical region: Secondary | ICD-10-CM | POA: Diagnosis not present

## 2019-01-23 DIAGNOSIS — M545 Low back pain: Secondary | ICD-10-CM | POA: Diagnosis not present

## 2019-01-23 DIAGNOSIS — M542 Cervicalgia: Secondary | ICD-10-CM | POA: Diagnosis not present

## 2019-01-23 DIAGNOSIS — M50322 Other cervical disc degeneration at C5-C6 level: Secondary | ICD-10-CM | POA: Diagnosis not present

## 2019-01-23 DIAGNOSIS — M50321 Other cervical disc degeneration at C4-C5 level: Secondary | ICD-10-CM | POA: Diagnosis not present

## 2019-01-23 DIAGNOSIS — M9903 Segmental and somatic dysfunction of lumbar region: Secondary | ICD-10-CM | POA: Diagnosis not present

## 2019-01-23 DIAGNOSIS — M50323 Other cervical disc degeneration at C6-C7 level: Secondary | ICD-10-CM | POA: Diagnosis not present

## 2019-01-23 DIAGNOSIS — M9902 Segmental and somatic dysfunction of thoracic region: Secondary | ICD-10-CM | POA: Diagnosis not present

## 2019-01-23 DIAGNOSIS — M5136 Other intervertebral disc degeneration, lumbar region: Secondary | ICD-10-CM | POA: Diagnosis not present

## 2019-02-01 DIAGNOSIS — M50323 Other cervical disc degeneration at C6-C7 level: Secondary | ICD-10-CM | POA: Diagnosis not present

## 2019-02-01 DIAGNOSIS — M5136 Other intervertebral disc degeneration, lumbar region: Secondary | ICD-10-CM | POA: Diagnosis not present

## 2019-02-01 DIAGNOSIS — M50321 Other cervical disc degeneration at C4-C5 level: Secondary | ICD-10-CM | POA: Diagnosis not present

## 2019-02-01 DIAGNOSIS — M545 Low back pain: Secondary | ICD-10-CM | POA: Diagnosis not present

## 2019-02-01 DIAGNOSIS — M9901 Segmental and somatic dysfunction of cervical region: Secondary | ICD-10-CM | POA: Diagnosis not present

## 2019-02-01 DIAGNOSIS — M9903 Segmental and somatic dysfunction of lumbar region: Secondary | ICD-10-CM | POA: Diagnosis not present

## 2019-02-01 DIAGNOSIS — M50322 Other cervical disc degeneration at C5-C6 level: Secondary | ICD-10-CM | POA: Diagnosis not present

## 2019-02-01 DIAGNOSIS — M542 Cervicalgia: Secondary | ICD-10-CM | POA: Diagnosis not present

## 2019-02-01 DIAGNOSIS — M9902 Segmental and somatic dysfunction of thoracic region: Secondary | ICD-10-CM | POA: Diagnosis not present

## 2019-02-07 DIAGNOSIS — M9903 Segmental and somatic dysfunction of lumbar region: Secondary | ICD-10-CM | POA: Diagnosis not present

## 2019-02-07 DIAGNOSIS — M5136 Other intervertebral disc degeneration, lumbar region: Secondary | ICD-10-CM | POA: Diagnosis not present

## 2019-02-07 DIAGNOSIS — M545 Low back pain: Secondary | ICD-10-CM | POA: Diagnosis not present

## 2019-02-07 DIAGNOSIS — M50322 Other cervical disc degeneration at C5-C6 level: Secondary | ICD-10-CM | POA: Diagnosis not present

## 2019-02-07 DIAGNOSIS — M542 Cervicalgia: Secondary | ICD-10-CM | POA: Diagnosis not present

## 2019-02-07 DIAGNOSIS — M9902 Segmental and somatic dysfunction of thoracic region: Secondary | ICD-10-CM | POA: Diagnosis not present

## 2019-02-07 DIAGNOSIS — M9901 Segmental and somatic dysfunction of cervical region: Secondary | ICD-10-CM | POA: Diagnosis not present

## 2019-02-07 DIAGNOSIS — M50321 Other cervical disc degeneration at C4-C5 level: Secondary | ICD-10-CM | POA: Diagnosis not present

## 2019-02-07 DIAGNOSIS — M50323 Other cervical disc degeneration at C6-C7 level: Secondary | ICD-10-CM | POA: Diagnosis not present

## 2019-02-18 ENCOUNTER — Other Ambulatory Visit: Payer: Self-pay

## 2019-02-18 ENCOUNTER — Encounter (HOSPITAL_COMMUNITY): Payer: Self-pay

## 2019-02-18 ENCOUNTER — Emergency Department (HOSPITAL_COMMUNITY)
Admission: EM | Admit: 2019-02-18 | Discharge: 2019-02-18 | Disposition: A | Discharge status: Home / Self Care | Payer: PPO | Attending: Emergency Medicine | Admitting: Emergency Medicine

## 2019-02-18 ENCOUNTER — Emergency Department (HOSPITAL_COMMUNITY): Payer: PPO

## 2019-02-18 DIAGNOSIS — M542 Cervicalgia: Secondary | ICD-10-CM | POA: Insufficient documentation

## 2019-02-18 DIAGNOSIS — Z79899 Other long term (current) drug therapy: Secondary | ICD-10-CM | POA: Insufficient documentation

## 2019-02-18 DIAGNOSIS — R079 Chest pain, unspecified: Secondary | ICD-10-CM | POA: Diagnosis not present

## 2019-02-18 DIAGNOSIS — R0789 Other chest pain: Secondary | ICD-10-CM | POA: Diagnosis not present

## 2019-02-18 DIAGNOSIS — I10 Essential (primary) hypertension: Secondary | ICD-10-CM | POA: Diagnosis not present

## 2019-02-18 DIAGNOSIS — M25512 Pain in left shoulder: Secondary | ICD-10-CM | POA: Insufficient documentation

## 2019-02-18 LAB — BASIC METABOLIC PANEL
Anion gap: 7 (ref 5–15)
BUN: 7 mg/dL — ABNORMAL LOW (ref 8–23)
CO2: 28 mmol/L (ref 22–32)
Calcium: 8.8 mg/dL — ABNORMAL LOW (ref 8.9–10.3)
Chloride: 101 mmol/L (ref 98–111)
Creatinine, Ser: 0.56 mg/dL (ref 0.44–1.00)
GFR calc Af Amer: >60 mL/min (ref >60)
GFR calc non Af Amer: >60 mL/min (ref >60)
Glucose, Bld: 94 mg/dL (ref 70–99)
Potassium: 3.3 mmol/L — ABNORMAL LOW (ref 3.5–5.1)
Sodium: 136 mmol/L (ref 135–145)

## 2019-02-18 LAB — CBC
HCT: 33.8 % — ABNORMAL LOW (ref 36.0–46.0)
Hemoglobin: 11.1 g/dL — ABNORMAL LOW (ref 12.0–15.0)
MCH: 29.1 pg (ref 26.0–34.0)
MCHC: 32.8 g/dL (ref 30.0–36.0)
MCV: 88.5 fL (ref 80.0–100.0)
Platelets: 287 10*3/uL (ref 150–400)
RBC: 3.82 MIL/uL — ABNORMAL LOW (ref 3.87–5.11)
RDW: 13.5 % (ref 11.5–15.5)
WBC: 7.9 10*3/uL (ref 4.0–10.5)
nRBC: 0 % (ref 0.0–0.2)

## 2019-02-18 LAB — TROPONIN I (HIGH SENSITIVITY)
Troponin I (High Sensitivity): 4 ng/L (ref <18)
Troponin I (High Sensitivity): 4 ng/L (ref <18)

## 2019-02-18 MED ORDER — SODIUM CHLORIDE 0.9% FLUSH: 3.0000 mL | Freq: Once | INTRAVENOUS | Status: DC

## 2019-02-18 NOTE — ED Provider Notes (Signed)
  Physical Exam  BP (!) 176/100   Pulse 69   Temp 98 F (36.7 C) (Oral)   Resp 10   Ht 5\' 3"  (1.6 m)   Wt 54.4 kg   SpO2 95%   BMI 21.26 kg/m   Physical Exam  ED Course/Procedures     Procedures  MDM  Received patient in signout.  Chest pain.  Likely chest wall pain.  Reproducible.  Troponin negative x2.  EKG reassuring.  Discharge home       Davonna Belling, MD 02/18/19 Bosie Helper

## 2019-02-18 NOTE — ED Triage Notes (Signed)
Patient c/o constant left chest pain that radiates into the left neck and shoulder x 3 days. Patient c/o slight SOB. "I feel like my heart is swollen."
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
## 2019-02-19 DIAGNOSIS — M9903 Segmental and somatic dysfunction of lumbar region: Secondary | ICD-10-CM | POA: Diagnosis not present

## 2019-02-19 DIAGNOSIS — M545 Low back pain: Secondary | ICD-10-CM | POA: Diagnosis not present

## 2019-02-19 DIAGNOSIS — M50321 Other cervical disc degeneration at C4-C5 level: Secondary | ICD-10-CM | POA: Diagnosis not present

## 2019-02-19 DIAGNOSIS — M9901 Segmental and somatic dysfunction of cervical region: Secondary | ICD-10-CM | POA: Diagnosis not present

## 2019-02-19 DIAGNOSIS — M5136 Other intervertebral disc degeneration, lumbar region: Secondary | ICD-10-CM | POA: Diagnosis not present

## 2019-02-19 DIAGNOSIS — M542 Cervicalgia: Secondary | ICD-10-CM | POA: Diagnosis not present

## 2019-02-19 DIAGNOSIS — M50322 Other cervical disc degeneration at C5-C6 level: Secondary | ICD-10-CM | POA: Diagnosis not present

## 2019-02-19 DIAGNOSIS — M50323 Other cervical disc degeneration at C6-C7 level: Secondary | ICD-10-CM | POA: Diagnosis not present

## 2019-02-19 DIAGNOSIS — M9902 Segmental and somatic dysfunction of thoracic region: Secondary | ICD-10-CM | POA: Diagnosis not present

## 2019-02-22 DIAGNOSIS — M9903 Segmental and somatic dysfunction of lumbar region: Secondary | ICD-10-CM | POA: Diagnosis not present

## 2019-02-22 DIAGNOSIS — M5136 Other intervertebral disc degeneration, lumbar region: Secondary | ICD-10-CM | POA: Diagnosis not present

## 2019-02-22 DIAGNOSIS — M50323 Other cervical disc degeneration at C6-C7 level: Secondary | ICD-10-CM | POA: Diagnosis not present

## 2019-02-22 DIAGNOSIS — M50322 Other cervical disc degeneration at C5-C6 level: Secondary | ICD-10-CM | POA: Diagnosis not present

## 2019-02-22 DIAGNOSIS — M50321 Other cervical disc degeneration at C4-C5 level: Secondary | ICD-10-CM | POA: Diagnosis not present

## 2019-02-22 DIAGNOSIS — M545 Low back pain: Secondary | ICD-10-CM | POA: Diagnosis not present

## 2019-02-22 DIAGNOSIS — M542 Cervicalgia: Secondary | ICD-10-CM | POA: Diagnosis not present

## 2019-02-22 DIAGNOSIS — M9902 Segmental and somatic dysfunction of thoracic region: Secondary | ICD-10-CM | POA: Diagnosis not present

## 2019-02-22 DIAGNOSIS — M9901 Segmental and somatic dysfunction of cervical region: Secondary | ICD-10-CM | POA: Diagnosis not present

## 2019-02-27 DIAGNOSIS — M9903 Segmental and somatic dysfunction of lumbar region: Secondary | ICD-10-CM | POA: Diagnosis not present

## 2019-02-27 DIAGNOSIS — M50323 Other cervical disc degeneration at C6-C7 level: Secondary | ICD-10-CM | POA: Diagnosis not present

## 2019-02-27 DIAGNOSIS — M9901 Segmental and somatic dysfunction of cervical region: Secondary | ICD-10-CM | POA: Diagnosis not present

## 2019-02-27 DIAGNOSIS — M542 Cervicalgia: Secondary | ICD-10-CM | POA: Diagnosis not present

## 2019-02-27 DIAGNOSIS — M5136 Other intervertebral disc degeneration, lumbar region: Secondary | ICD-10-CM | POA: Diagnosis not present

## 2019-02-27 DIAGNOSIS — M9902 Segmental and somatic dysfunction of thoracic region: Secondary | ICD-10-CM | POA: Diagnosis not present

## 2019-02-27 DIAGNOSIS — M545 Low back pain: Secondary | ICD-10-CM | POA: Diagnosis not present

## 2019-02-27 DIAGNOSIS — M50321 Other cervical disc degeneration at C4-C5 level: Secondary | ICD-10-CM | POA: Diagnosis not present

## 2019-02-27 DIAGNOSIS — M50322 Other cervical disc degeneration at C5-C6 level: Secondary | ICD-10-CM | POA: Diagnosis not present

## 2019-03-08 DIAGNOSIS — M542 Cervicalgia: Secondary | ICD-10-CM | POA: Diagnosis not present

## 2019-03-08 DIAGNOSIS — M50322 Other cervical disc degeneration at C5-C6 level: Secondary | ICD-10-CM | POA: Diagnosis not present

## 2019-03-08 DIAGNOSIS — M50323 Other cervical disc degeneration at C6-C7 level: Secondary | ICD-10-CM | POA: Diagnosis not present

## 2019-03-08 DIAGNOSIS — M50321 Other cervical disc degeneration at C4-C5 level: Secondary | ICD-10-CM | POA: Diagnosis not present

## 2019-03-08 DIAGNOSIS — M545 Low back pain: Secondary | ICD-10-CM | POA: Diagnosis not present

## 2019-03-08 DIAGNOSIS — M9903 Segmental and somatic dysfunction of lumbar region: Secondary | ICD-10-CM | POA: Diagnosis not present

## 2019-03-08 DIAGNOSIS — M9901 Segmental and somatic dysfunction of cervical region: Secondary | ICD-10-CM | POA: Diagnosis not present

## 2019-03-08 DIAGNOSIS — M5136 Other intervertebral disc degeneration, lumbar region: Secondary | ICD-10-CM | POA: Diagnosis not present

## 2019-03-08 DIAGNOSIS — M9902 Segmental and somatic dysfunction of thoracic region: Secondary | ICD-10-CM | POA: Diagnosis not present

## 2019-03-20 DIAGNOSIS — M50322 Other cervical disc degeneration at C5-C6 level: Secondary | ICD-10-CM | POA: Diagnosis not present

## 2019-03-20 DIAGNOSIS — M9901 Segmental and somatic dysfunction of cervical region: Secondary | ICD-10-CM | POA: Diagnosis not present

## 2019-03-20 DIAGNOSIS — M50321 Other cervical disc degeneration at C4-C5 level: Secondary | ICD-10-CM | POA: Diagnosis not present

## 2019-03-20 DIAGNOSIS — M9902 Segmental and somatic dysfunction of thoracic region: Secondary | ICD-10-CM | POA: Diagnosis not present

## 2019-03-20 DIAGNOSIS — M50323 Other cervical disc degeneration at C6-C7 level: Secondary | ICD-10-CM | POA: Diagnosis not present

## 2019-03-20 DIAGNOSIS — M545 Low back pain: Secondary | ICD-10-CM | POA: Diagnosis not present

## 2019-03-20 DIAGNOSIS — M542 Cervicalgia: Secondary | ICD-10-CM | POA: Diagnosis not present

## 2019-03-20 DIAGNOSIS — M5136 Other intervertebral disc degeneration, lumbar region: Secondary | ICD-10-CM | POA: Diagnosis not present

## 2019-03-20 DIAGNOSIS — M9903 Segmental and somatic dysfunction of lumbar region: Secondary | ICD-10-CM | POA: Diagnosis not present

## 2019-04-05 DIAGNOSIS — M50321 Other cervical disc degeneration at C4-C5 level: Secondary | ICD-10-CM | POA: Diagnosis not present

## 2019-04-05 DIAGNOSIS — M545 Low back pain: Secondary | ICD-10-CM | POA: Diagnosis not present

## 2019-04-05 DIAGNOSIS — M5136 Other intervertebral disc degeneration, lumbar region: Secondary | ICD-10-CM | POA: Diagnosis not present

## 2019-04-05 DIAGNOSIS — M50323 Other cervical disc degeneration at C6-C7 level: Secondary | ICD-10-CM | POA: Diagnosis not present

## 2019-04-05 DIAGNOSIS — M9902 Segmental and somatic dysfunction of thoracic region: Secondary | ICD-10-CM | POA: Diagnosis not present

## 2019-04-05 DIAGNOSIS — M9903 Segmental and somatic dysfunction of lumbar region: Secondary | ICD-10-CM | POA: Diagnosis not present

## 2019-04-05 DIAGNOSIS — M9901 Segmental and somatic dysfunction of cervical region: Secondary | ICD-10-CM | POA: Diagnosis not present

## 2019-04-05 DIAGNOSIS — M50322 Other cervical disc degeneration at C5-C6 level: Secondary | ICD-10-CM | POA: Diagnosis not present

## 2019-04-05 DIAGNOSIS — M542 Cervicalgia: Secondary | ICD-10-CM | POA: Diagnosis not present

## 2019-04-30 DIAGNOSIS — Z9989 Dependence on other enabling machines and devices: Secondary | ICD-10-CM | POA: Diagnosis not present

## 2019-04-30 DIAGNOSIS — M79671 Pain in right foot: Secondary | ICD-10-CM | POA: Diagnosis not present

## 2019-04-30 DIAGNOSIS — M109 Gout, unspecified: Secondary | ICD-10-CM | POA: Diagnosis not present

## 2019-04-30 DIAGNOSIS — M79674 Pain in right toe(s): Secondary | ICD-10-CM | POA: Diagnosis not present

## 2019-05-07 DIAGNOSIS — M5136 Other intervertebral disc degeneration, lumbar region: Secondary | ICD-10-CM | POA: Diagnosis not present

## 2019-05-07 DIAGNOSIS — M542 Cervicalgia: Secondary | ICD-10-CM | POA: Diagnosis not present

## 2019-05-07 DIAGNOSIS — M9901 Segmental and somatic dysfunction of cervical region: Secondary | ICD-10-CM | POA: Diagnosis not present

## 2019-05-07 DIAGNOSIS — M9903 Segmental and somatic dysfunction of lumbar region: Secondary | ICD-10-CM | POA: Diagnosis not present

## 2019-05-07 DIAGNOSIS — M50323 Other cervical disc degeneration at C6-C7 level: Secondary | ICD-10-CM | POA: Diagnosis not present

## 2019-05-07 DIAGNOSIS — M9902 Segmental and somatic dysfunction of thoracic region: Secondary | ICD-10-CM | POA: Diagnosis not present

## 2019-05-07 DIAGNOSIS — M50322 Other cervical disc degeneration at C5-C6 level: Secondary | ICD-10-CM | POA: Diagnosis not present

## 2019-05-07 DIAGNOSIS — M545 Low back pain: Secondary | ICD-10-CM | POA: Diagnosis not present

## 2019-05-07 DIAGNOSIS — M50321 Other cervical disc degeneration at C4-C5 level: Secondary | ICD-10-CM | POA: Diagnosis not present

## 2019-05-14 DIAGNOSIS — S29012S Strain of muscle and tendon of back wall of thorax, sequela: Secondary | ICD-10-CM | POA: Diagnosis not present

## 2019-05-16 DIAGNOSIS — M9901 Segmental and somatic dysfunction of cervical region: Secondary | ICD-10-CM | POA: Diagnosis not present

## 2019-05-16 DIAGNOSIS — M542 Cervicalgia: Secondary | ICD-10-CM | POA: Diagnosis not present

## 2019-05-16 DIAGNOSIS — M9903 Segmental and somatic dysfunction of lumbar region: Secondary | ICD-10-CM | POA: Diagnosis not present

## 2019-05-16 DIAGNOSIS — M545 Low back pain: Secondary | ICD-10-CM | POA: Diagnosis not present

## 2019-05-16 DIAGNOSIS — M9902 Segmental and somatic dysfunction of thoracic region: Secondary | ICD-10-CM | POA: Diagnosis not present

## 2019-05-16 DIAGNOSIS — M5136 Other intervertebral disc degeneration, lumbar region: Secondary | ICD-10-CM | POA: Diagnosis not present

## 2019-05-16 DIAGNOSIS — M50321 Other cervical disc degeneration at C4-C5 level: Secondary | ICD-10-CM | POA: Diagnosis not present

## 2019-05-16 DIAGNOSIS — M50323 Other cervical disc degeneration at C6-C7 level: Secondary | ICD-10-CM | POA: Diagnosis not present

## 2019-05-16 DIAGNOSIS — M50322 Other cervical disc degeneration at C5-C6 level: Secondary | ICD-10-CM | POA: Diagnosis not present

## 2019-05-21 DIAGNOSIS — I10 Essential (primary) hypertension: Secondary | ICD-10-CM | POA: Insufficient documentation

## 2019-05-21 DIAGNOSIS — M5030 Other cervical disc degeneration, unspecified cervical region: Secondary | ICD-10-CM | POA: Diagnosis not present

## 2019-05-21 DIAGNOSIS — E7800 Pure hypercholesterolemia, unspecified: Secondary | ICD-10-CM | POA: Insufficient documentation

## 2019-05-21 DIAGNOSIS — M542 Cervicalgia: Secondary | ICD-10-CM | POA: Diagnosis not present

## 2019-05-23 ENCOUNTER — Ambulatory Visit: Payer: PPO

## 2019-05-23 ENCOUNTER — Encounter: Payer: Self-pay | Admitting: Family Medicine

## 2019-05-23 ENCOUNTER — Other Ambulatory Visit: Payer: Self-pay

## 2019-05-23 ENCOUNTER — Ambulatory Visit (INDEPENDENT_AMBULATORY_CARE_PROVIDER_SITE_OTHER): Payer: PPO | Admitting: Family Medicine

## 2019-05-23 VITALS — Ht 63.0 in | Wt 120.0 lb

## 2019-05-23 DIAGNOSIS — M542 Cervicalgia: Secondary | ICD-10-CM | POA: Diagnosis not present

## 2019-05-23 MED ORDER — BACLOFEN 10 MG PO TABS: 5.0000 mg | ORAL_TABLET | Freq: Every evening | ORAL | 1 refills | Status: DC | PRN
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
## 2019-05-29 DIAGNOSIS — M9901 Segmental and somatic dysfunction of cervical region: Secondary | ICD-10-CM | POA: Diagnosis not present

## 2019-05-29 DIAGNOSIS — M545 Low back pain: Secondary | ICD-10-CM | POA: Diagnosis not present

## 2019-05-29 DIAGNOSIS — M542 Cervicalgia: Secondary | ICD-10-CM | POA: Diagnosis not present

## 2019-05-29 DIAGNOSIS — M9902 Segmental and somatic dysfunction of thoracic region: Secondary | ICD-10-CM | POA: Diagnosis not present

## 2019-05-29 DIAGNOSIS — M9903 Segmental and somatic dysfunction of lumbar region: Secondary | ICD-10-CM | POA: Diagnosis not present

## 2019-05-29 DIAGNOSIS — M5136 Other intervertebral disc degeneration, lumbar region: Secondary | ICD-10-CM | POA: Diagnosis not present

## 2019-05-29 DIAGNOSIS — M50322 Other cervical disc degeneration at C5-C6 level: Secondary | ICD-10-CM | POA: Diagnosis not present

## 2019-05-29 DIAGNOSIS — M50323 Other cervical disc degeneration at C6-C7 level: Secondary | ICD-10-CM | POA: Diagnosis not present

## 2019-05-29 DIAGNOSIS — M50321 Other cervical disc degeneration at C4-C5 level: Secondary | ICD-10-CM | POA: Diagnosis not present

## 2019-06-05 DIAGNOSIS — M9901 Segmental and somatic dysfunction of cervical region: Secondary | ICD-10-CM | POA: Diagnosis not present

## 2019-06-05 DIAGNOSIS — M545 Low back pain: Secondary | ICD-10-CM | POA: Diagnosis not present

## 2019-06-05 DIAGNOSIS — M50321 Other cervical disc degeneration at C4-C5 level: Secondary | ICD-10-CM | POA: Diagnosis not present

## 2019-06-05 DIAGNOSIS — M9903 Segmental and somatic dysfunction of lumbar region: Secondary | ICD-10-CM | POA: Diagnosis not present

## 2019-06-05 DIAGNOSIS — M542 Cervicalgia: Secondary | ICD-10-CM | POA: Diagnosis not present

## 2019-06-05 DIAGNOSIS — M50322 Other cervical disc degeneration at C5-C6 level: Secondary | ICD-10-CM | POA: Diagnosis not present

## 2019-06-05 DIAGNOSIS — M5136 Other intervertebral disc degeneration, lumbar region: Secondary | ICD-10-CM | POA: Diagnosis not present

## 2019-06-05 DIAGNOSIS — M9902 Segmental and somatic dysfunction of thoracic region: Secondary | ICD-10-CM | POA: Diagnosis not present

## 2019-06-05 DIAGNOSIS — M50323 Other cervical disc degeneration at C6-C7 level: Secondary | ICD-10-CM | POA: Diagnosis not present

## 2019-06-12 ENCOUNTER — Other Ambulatory Visit: Payer: Self-pay

## 2019-06-12 ENCOUNTER — Ambulatory Visit
Admission: RE | Admit: 2019-06-12 | Discharge: 2019-06-12 | Disposition: A | Discharge status: Home / Self Care | Payer: PPO | Source: Ambulatory Visit | Attending: Family Medicine | Admitting: Family Medicine

## 2019-06-12 ENCOUNTER — Telehealth: Payer: Self-pay | Admitting: Family Medicine

## 2019-06-12 DIAGNOSIS — M4802 Spinal stenosis, cervical region: Secondary | ICD-10-CM | POA: Diagnosis not present

## 2019-06-12 DIAGNOSIS — M542 Cervicalgia: Secondary | ICD-10-CM

## 2019-06-12 NOTE — Telephone Encounter (Signed)
Neck MRI shows bone spurs and bulging discs at several levels.  This causes narrowing of the spinal canal and nerve openings.  Treatment options would include referral to Dr. Ernestina Patches for an epidural steroid injection, or possibly referral to a spine surgeon for surgical consult.  Whichever she prefers is fine.

## 2019-06-13 DIAGNOSIS — M9901 Segmental and somatic dysfunction of cervical region: Secondary | ICD-10-CM | POA: Diagnosis not present

## 2019-06-13 DIAGNOSIS — M545 Low back pain: Secondary | ICD-10-CM | POA: Diagnosis not present

## 2019-06-13 DIAGNOSIS — M5136 Other intervertebral disc degeneration, lumbar region: Secondary | ICD-10-CM | POA: Diagnosis not present

## 2019-06-13 DIAGNOSIS — M50323 Other cervical disc degeneration at C6-C7 level: Secondary | ICD-10-CM | POA: Diagnosis not present

## 2019-06-13 DIAGNOSIS — M9903 Segmental and somatic dysfunction of lumbar region: Secondary | ICD-10-CM | POA: Diagnosis not present

## 2019-06-13 DIAGNOSIS — M50322 Other cervical disc degeneration at C5-C6 level: Secondary | ICD-10-CM | POA: Diagnosis not present

## 2019-06-13 DIAGNOSIS — M542 Cervicalgia: Secondary | ICD-10-CM | POA: Diagnosis not present

## 2019-06-13 DIAGNOSIS — M9902 Segmental and somatic dysfunction of thoracic region: Secondary | ICD-10-CM | POA: Diagnosis not present

## 2019-06-13 DIAGNOSIS — M50321 Other cervical disc degeneration at C4-C5 level: Secondary | ICD-10-CM | POA: Diagnosis not present

## 2019-06-14 NOTE — Telephone Encounter (Signed)
I called and advised the patient of her results. She asked that I fax the results to her chiropractor, Dr. Zack Seal (done), so she can discuss the findings with him, as well (she sees him 2 x week). Advised her of her options -- she would like to be referred to a spine surgeon (and possibly get an ESI in the future, if the surgeon also suggests that).

## 2019-06-15 NOTE — Telephone Encounter (Signed)
Please schedule consult with MY to discuss possible neck surgery.

## 2019-06-15 NOTE — Telephone Encounter (Signed)
I called patient. Appt made for 06/26/2019.

## 2019-06-19 ENCOUNTER — Telehealth: Payer: Self-pay | Admitting: Family Medicine

## 2019-06-19 NOTE — Telephone Encounter (Signed)
Left a message on the patient's voice mail to call back. Just need clarification on which other doctor to which she is referring -- does she mean Dr. Ernestina Patches for an ESI? She already has an appointment set with Dr. Lorin Mercy.

## 2019-06-19 NOTE — Telephone Encounter (Signed)
Patient called advised she would like to make an appointment with both doctors that Dr. Junius Roads mentioned to figure out exactly what she want to do.  Patient said she wan to get the opinion of both doctors. The number to contact patient is [phone]

## 2019-06-20 DIAGNOSIS — M50323 Other cervical disc degeneration at C6-C7 level: Secondary | ICD-10-CM | POA: Diagnosis not present

## 2019-06-20 DIAGNOSIS — M9903 Segmental and somatic dysfunction of lumbar region: Secondary | ICD-10-CM | POA: Diagnosis not present

## 2019-06-20 DIAGNOSIS — M545 Low back pain: Secondary | ICD-10-CM | POA: Diagnosis not present

## 2019-06-20 DIAGNOSIS — M542 Cervicalgia: Secondary | ICD-10-CM | POA: Diagnosis not present

## 2019-06-20 DIAGNOSIS — M50322 Other cervical disc degeneration at C5-C6 level: Secondary | ICD-10-CM | POA: Diagnosis not present

## 2019-06-20 DIAGNOSIS — M9901 Segmental and somatic dysfunction of cervical region: Secondary | ICD-10-CM | POA: Diagnosis not present

## 2019-06-20 DIAGNOSIS — M9902 Segmental and somatic dysfunction of thoracic region: Secondary | ICD-10-CM | POA: Diagnosis not present

## 2019-06-20 DIAGNOSIS — M5136 Other intervertebral disc degeneration, lumbar region: Secondary | ICD-10-CM | POA: Diagnosis not present

## 2019-06-20 DIAGNOSIS — M50321 Other cervical disc degeneration at C4-C5 level: Secondary | ICD-10-CM | POA: Diagnosis not present

## 2019-06-21 ENCOUNTER — Telehealth: Payer: Self-pay | Admitting: Family Medicine

## 2019-06-21 ENCOUNTER — Other Ambulatory Visit: Payer: Self-pay

## 2019-06-21 DIAGNOSIS — M542 Cervicalgia: Secondary | ICD-10-CM

## 2019-06-21 NOTE — Telephone Encounter (Signed)
See other message on this from today.

## 2019-06-21 NOTE — Telephone Encounter (Signed)
The patient would also like to go ahead and be referred to Dr. Ernestina Patches for possible ESI, as per possible treatment options from Dr. Junius Roads post her Csp MRI. Referral placed. She already has an appointment with Dr. Lorin Mercy on 06/26/19.

## 2019-06-21 NOTE — Telephone Encounter (Signed)
Pt called in returning terri's call, and would like a call back. And she wanted to let you know that she would like to see both doctor's you recommened her.   [phone]

## 2019-06-25 ENCOUNTER — Ambulatory Visit: Payer: PPO | Admitting: Orthopaedic Surgery

## 2019-06-26 ENCOUNTER — Other Ambulatory Visit: Payer: Self-pay

## 2019-06-26 ENCOUNTER — Ambulatory Visit (INDEPENDENT_AMBULATORY_CARE_PROVIDER_SITE_OTHER): Payer: PPO | Admitting: Orthopaedic Surgery

## 2019-06-26 ENCOUNTER — Encounter: Payer: Self-pay | Admitting: Orthopaedic Surgery

## 2019-06-26 ENCOUNTER — Ambulatory Visit: Payer: PPO | Admitting: Orthopaedic Surgery

## 2019-06-26 DIAGNOSIS — M4722 Other spondylosis with radiculopathy, cervical region: Secondary | ICD-10-CM | POA: Diagnosis not present
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
## 2019-06-28 ENCOUNTER — Telehealth: Payer: Self-pay | Admitting: Orthopaedic Surgery

## 2019-06-28 NOTE — Telephone Encounter (Signed)
Patient had MRI Cervical Spine at [facility] ordered by Dr. Junius Roads.  Are you able to send this or will she need to call [facility]? Thanks.

## 2019-06-28 NOTE — Telephone Encounter (Signed)
IC, spoke with pt, advised need to sign authorization before we can send MRI report to er chiropractor. She said she would come by office later today.

## 2019-06-28 NOTE — Telephone Encounter (Signed)
Patient called.   She is requesting that her MRI conclusions be shared with her chiropractor.   Her chiropractor's fax number: [phone]  Patient call back number: [phone]

## 2019-06-29 ENCOUNTER — Ambulatory Visit: Payer: PPO | Admitting: Orthopaedic Surgery

## 2019-06-29 ENCOUNTER — Other Ambulatory Visit: Payer: Self-pay | Admitting: Family Medicine

## 2019-07-03 ENCOUNTER — Ambulatory Visit: Payer: PPO | Admitting: Orthopaedic Surgery

## 2019-07-05 ENCOUNTER — Telehealth: Payer: Self-pay | Admitting: Family Medicine

## 2019-07-05 NOTE — Telephone Encounter (Signed)
Pt called wanting Dr. Junius Roads to know she's been speaking with Dr. Lorin Mercy and has an upcoming appt. with Dr. Ernestina Patches; she also stated that she is still having neck pains as well as a new popping.   [phone]

## 2019-07-06 NOTE — Telephone Encounter (Signed)
Patient aware.

## 2019-07-06 NOTE — Telephone Encounter (Signed)
Ok, I agree with the plan.

## 2019-07-10 ENCOUNTER — Other Ambulatory Visit: Payer: Self-pay

## 2019-07-10 ENCOUNTER — Encounter: Payer: Self-pay | Admitting: Physical Medicine and Rehabilitation

## 2019-07-10 ENCOUNTER — Ambulatory Visit (INDEPENDENT_AMBULATORY_CARE_PROVIDER_SITE_OTHER): Payer: PPO | Admitting: Physical Medicine and Rehabilitation

## 2019-07-10 VITALS — BP 152/103 | HR 66 | Ht 64.0 in | Wt 120.0 lb

## 2019-07-10 DIAGNOSIS — M4802 Spinal stenosis, cervical region: Secondary | ICD-10-CM | POA: Diagnosis not present

## 2019-07-10 DIAGNOSIS — M5412 Radiculopathy, cervical region: Secondary | ICD-10-CM

## 2019-07-10 DIAGNOSIS — M47812 Spondylosis without myelopathy or radiculopathy, cervical region: Secondary | ICD-10-CM | POA: Diagnosis not present

## 2019-07-10 DIAGNOSIS — M5010 Cervical disc disorder with radiculopathy, unspecified cervical region: Secondary | ICD-10-CM

## 2019-07-10 NOTE — Progress Notes (Signed)
   Numeric Pain Rating Scale and Functional Assessment Average Pain 6   In the last MONTH (on 0-10 scale) has pain interfered with the following?  1. General activity like being  able to carry out your everyday physical activities such as walking, climbing stairs, carrying groceries, or moving a chair?  Rating(3) 

## 2019-07-19 ENCOUNTER — Encounter: Payer: PPO | Admitting: Physical Medicine and Rehabilitation

## 2019-08-13 ENCOUNTER — Ambulatory Visit (INDEPENDENT_AMBULATORY_CARE_PROVIDER_SITE_OTHER): Payer: PPO | Admitting: Family Medicine

## 2019-08-13 ENCOUNTER — Other Ambulatory Visit: Payer: Self-pay

## 2019-08-13 ENCOUNTER — Encounter: Payer: Self-pay | Admitting: Family Medicine

## 2019-08-13 DIAGNOSIS — M109 Gout, unspecified: Secondary | ICD-10-CM | POA: Insufficient documentation

## 2019-08-13 DIAGNOSIS — M542 Cervicalgia: Secondary | ICD-10-CM | POA: Diagnosis not present

## 2019-08-13 DIAGNOSIS — F419 Anxiety disorder, unspecified: Secondary | ICD-10-CM | POA: Insufficient documentation
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
## 2019-09-25 ENCOUNTER — Other Ambulatory Visit: Payer: Self-pay

## 2019-09-25 ENCOUNTER — Encounter: Payer: Self-pay | Admitting: Orthopaedic Surgery

## 2019-09-25 ENCOUNTER — Ambulatory Visit (INDEPENDENT_AMBULATORY_CARE_PROVIDER_SITE_OTHER): Payer: PPO | Admitting: Orthopaedic Surgery

## 2019-09-25 VITALS — BP 144/80 | HR 70 | Ht 64.0 in | Wt 120.0 lb

## 2019-09-25 DIAGNOSIS — M4722 Other spondylosis with radiculopathy, cervical region: Secondary | ICD-10-CM | POA: Diagnosis not present

## 2019-09-25 MED ORDER — DIAZEPAM 5 MG PO TABS: ORAL_TABLET | ORAL | 0 refills | Status: DC

## 2019-09-25 NOTE — Addendum Note (Signed)
Addended by: Meyer Cory on: 09/25/2019 02:41 PM   Modules accepted: Orders
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
## 2019-10-02 ENCOUNTER — Telehealth: Payer: Self-pay | Admitting: Orthopaedic Surgery

## 2019-10-02 NOTE — Telephone Encounter (Signed)
I called pt, lmvm to [facility] regarding the release form she completed.

## 2019-10-03 NOTE — Telephone Encounter (Signed)
Patient returned my call. I advised her the date range on the release form is not filled in and she is wanting all records to be sent to Dr. Donovan Kail and her attorney. She will come in tomorrow to fill in the date range.

## 2019-10-18 ENCOUNTER — Telehealth: Payer: Self-pay | Admitting: Orthopaedic Surgery

## 2019-10-18 NOTE — Telephone Encounter (Signed)
LMOM for patient to call the office to schedule their MRI Review with Dr. Lorin Mercy after June 8th.  Thank you

## 2019-10-22 ENCOUNTER — Encounter: Payer: Self-pay | Admitting: Physical Medicine and Rehabilitation
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
## 2019-11-07 DIAGNOSIS — M109 Gout, unspecified: Secondary | ICD-10-CM | POA: Diagnosis not present

## 2019-11-07 DIAGNOSIS — S29012S Strain of muscle and tendon of back wall of thorax, sequela: Secondary | ICD-10-CM | POA: Diagnosis not present

## 2019-11-07 DIAGNOSIS — M10042 Idiopathic gout, left hand: Secondary | ICD-10-CM | POA: Diagnosis not present

## 2019-11-12 ENCOUNTER — Telehealth: Payer: Self-pay | Admitting: Orthopaedic Surgery

## 2019-11-12 NOTE — Telephone Encounter (Signed)
Ok for valium? 

## 2019-11-12 NOTE — Telephone Encounter (Signed)
Patient called.   She is requesting valium for her upcoming MRI   Call back: [phone]

## 2019-11-13 ENCOUNTER — Ambulatory Visit
Admission: RE | Admit: 2019-11-13 | Discharge: 2019-11-13 | Disposition: A | Discharge status: Home / Self Care | Payer: PPO | Source: Ambulatory Visit | Attending: Orthopaedic Surgery | Admitting: Orthopaedic Surgery

## 2019-11-13 ENCOUNTER — Other Ambulatory Visit: Payer: Self-pay

## 2019-11-13 DIAGNOSIS — M50221 Other cervical disc displacement at C4-C5 level: Secondary | ICD-10-CM | POA: Diagnosis not present

## 2019-11-13 DIAGNOSIS — M47812 Spondylosis without myelopathy or radiculopathy, cervical region: Secondary | ICD-10-CM | POA: Diagnosis not present

## 2019-11-13 DIAGNOSIS — M4722 Other spondylosis with radiculopathy, cervical region: Secondary | ICD-10-CM

## 2019-11-13 MED ORDER — DIAZEPAM 5 MG PO TABS: ORAL_TABLET | ORAL | 0 refills | Status: DC

## 2019-11-13 NOTE — Telephone Encounter (Signed)
Called to pharmacy. Patient advised.  

## 2019-11-13 NOTE — Addendum Note (Signed)
Addended by: Meyer Cory on: 11/13/2019 11:05 AM   Modules accepted: Orders

## 2019-11-13 NOTE — Telephone Encounter (Signed)
Ok thanks 

## 2019-11-20 ENCOUNTER — Ambulatory Visit (INDEPENDENT_AMBULATORY_CARE_PROVIDER_SITE_OTHER): Payer: PPO | Admitting: Orthopaedic Surgery

## 2019-11-20 ENCOUNTER — Ambulatory Visit: Payer: PPO | Admitting: Orthopaedic Surgery

## 2019-11-20 ENCOUNTER — Other Ambulatory Visit: Payer: Self-pay

## 2019-11-20 ENCOUNTER — Encounter: Payer: Self-pay | Admitting: Orthopaedic Surgery

## 2019-11-20 VITALS — BP 125/74 | HR 59 | Ht 64.0 in | Wt 120.0 lb

## 2019-11-20 DIAGNOSIS — M4722 Other spondylosis with radiculopathy, cervical region: Secondary | ICD-10-CM | POA: Diagnosis not present
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
## 2019-12-31 ENCOUNTER — Telehealth: Payer: Self-pay | Admitting: Physical Medicine and Rehabilitation

## 2019-12-31 NOTE — Telephone Encounter (Signed)
Pt would like to set up an appt in regards to her neck popping.  [phone]

## 2020-01-01 NOTE — Telephone Encounter (Signed)
Looks like patient has an appointment with Dr. Junius Roads on 7/30 for this.

## 2020-01-04 ENCOUNTER — Ambulatory Visit: Payer: PPO | Admitting: Family Medicine

## 2020-01-09 ENCOUNTER — Ambulatory Visit: Payer: PPO | Admitting: Family Medicine

## 2020-01-22 ENCOUNTER — Ambulatory Visit: Payer: PPO | Admitting: Family Medicine

## 2020-02-18 ENCOUNTER — Ambulatory Visit (INDEPENDENT_AMBULATORY_CARE_PROVIDER_SITE_OTHER): Payer: PPO | Admitting: Family Medicine

## 2020-02-18 ENCOUNTER — Other Ambulatory Visit: Payer: Self-pay

## 2020-02-18 ENCOUNTER — Encounter: Payer: Self-pay | Admitting: Family Medicine

## 2020-02-18 DIAGNOSIS — M4722 Other spondylosis with radiculopathy, cervical region: Secondary | ICD-10-CM

## 2020-02-18 MED ORDER — BACLOFEN 10 MG PO TABS: ORAL_TABLET | ORAL | 1 refills | Status: DC
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
## 2020-03-03 ENCOUNTER — Other Ambulatory Visit: Payer: Self-pay | Admitting: Family Medicine

## 2020-03-14 ENCOUNTER — Telehealth: Payer: Self-pay | Admitting: Family Medicine

## 2020-03-14 NOTE — Telephone Encounter (Signed)
Please advise 

## 2020-03-14 NOTE — Telephone Encounter (Signed)
Pt called stating her neck has been extremely tight and its spreading to her head so now she has headaches; pt states she hasn't been taking the muscle relaxer's in fear of becoming to dependent. Pt would like a CB to advise her some things she can try that's not medicine?  [phone]

## 2020-03-14 NOTE — Telephone Encounter (Signed)
I would suggest resuming chiropractic.  If she's already doing it, then I will request PT instead.  If she'd like, I can also request referral for ESI.

## 2020-03-14 NOTE — Telephone Encounter (Signed)
I called and left options on patient's voice mail. Advised her to call us back to let us know how she would like to proceed, or if she has additional questions.

## 2020-03-21 ENCOUNTER — Telehealth: Payer: Self-pay | Admitting: Family Medicine

## 2020-03-21 NOTE — Telephone Encounter (Signed)
Patient called. Says she will continue to see the chiropractor and she is better.

## 2020-03-21 NOTE — Telephone Encounter (Signed)
FYI

## 2020-03-27 ENCOUNTER — Telehealth: Payer: Self-pay | Admitting: Family Medicine

## 2020-03-27 NOTE — Telephone Encounter (Signed)
Velora Heckler, scheduler in our office, is handling this.

## 2020-03-27 NOTE — Telephone Encounter (Signed)
Patient called requesting a call back from Lori [name]. Patient called earlier to speak with Dr. Junius Roads for attorney purposes.  Patient called and asked to speak to [name]. Please call Crystal D at [phone] before returning call to patient. Patient phone number is [phone].

## 2020-03-27 NOTE — Telephone Encounter (Signed)
Pt called stating her attorney Daisy Lazar would like to talk to Dr.Hilts about the condition of the pts neck due to this being a third party liability case  Eddie Dibbles CB# [phone]

## 2020-03-27 NOTE — Telephone Encounter (Signed)
I called the patient. I advised her that Crystal, in our office, has contacted Mr. Sheralyn Boatman and is handling this situation. I did print off her last ov note, so she could have it for her own records - she already has her other notes. She will pick this up at the front desk tomorrow.

## 2020-07-18 ENCOUNTER — Other Ambulatory Visit: Payer: Self-pay

## 2020-07-18 ENCOUNTER — Encounter (HOSPITAL_COMMUNITY): Payer: Self-pay

## 2020-07-18 ENCOUNTER — Emergency Department (HOSPITAL_COMMUNITY)
Admission: EM | Admit: 2020-07-18 | Discharge: 2020-07-18 | Disposition: A | Discharge status: Home / Self Care | Payer: PPO | Attending: Emergency Medicine | Admitting: Emergency Medicine

## 2020-07-18 DIAGNOSIS — K439 Ventral hernia without obstruction or gangrene: Secondary | ICD-10-CM | POA: Diagnosis not present

## 2020-07-18 DIAGNOSIS — Z79899 Other long term (current) drug therapy: Secondary | ICD-10-CM | POA: Insufficient documentation

## 2020-07-18 DIAGNOSIS — R1900 Intra-abdominal and pelvic swelling, mass and lump, unspecified site: Secondary | ICD-10-CM | POA: Diagnosis present

## 2020-07-18 DIAGNOSIS — I10 Essential (primary) hypertension: Secondary | ICD-10-CM | POA: Diagnosis not present

## 2020-07-18 NOTE — ED Triage Notes (Signed)
Pt arrived via walk in, states she noticed "lump" in middle of stomach last night, painful, worsening with palpation. Unresolved this morning so came to back sure everything was okay.
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
## 2020-07-18 NOTE — ED Notes (Signed)
Pt left prior to receiving dc paperwork

## 2020-07-18 NOTE — Discharge Instructions (Signed)
Your physical exam today showed a ventral hernia, which is a protrusion of fat through the wall of abdominal muscles.  As discussed, given it is easily reducible, this is not an emergency and you may follow-up with general surgery for further treatment of this.  You may take Tylenol or ibuprofen for pain.  Please make sure to return to the ER if you have any worsening of your abdominal pain, discoloration of the hernia, increasing in size, inability to push it back in, severe nausea and vomiting, or any other new or concerning symptoms.  Please make sure to follow-up with your primary care doctor as discussed.

## 2020-07-31 DIAGNOSIS — K429 Umbilical hernia without obstruction or gangrene: Secondary | ICD-10-CM | POA: Diagnosis not present

## 2020-08-29 ENCOUNTER — Encounter: Payer: Self-pay | Admitting: Family Medicine

## 2020-08-29 ENCOUNTER — Telehealth: Payer: Self-pay | Admitting: Family Medicine
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
## 2020-08-29 NOTE — Telephone Encounter (Signed)
Tried calling the patient - went to voice mail - mailbox full. Will try again later. Dr. Junius Roads did add that there is no need to come in if she is feeling better.

## 2020-08-29 NOTE — Telephone Encounter (Signed)
I spoke with the patient. She will pick up the letters this afternoon. She still has stiffness in her neck, but the chiropractor and massage is helping with that.

## 2020-08-29 NOTE — Telephone Encounter (Signed)
Please advise 

## 2020-08-29 NOTE — Telephone Encounter (Signed)
Printed

## 2020-09-09 IMAGING — MR MR CERVICAL SPINE W/O CM
4 of 7 series · 21 of 48 positions shown · non-contrast
Comparison: Cervical spine radiograph 05/23/2019. CT cervical spine
06/23/2018

CLINICAL DATA: Spinal stenosis.  Neck pain

EXAM:
MRI CERVICAL SPINE WITHOUT CONTRAST
TECHNIQUE: Multiplanar, multisequence MR imaging of the cervical spine was
performed. No intravenous contrast was administered.

[Series 2: T2 · sagittal · 3.0mm · 0.41mm/px · 6 of 15 slices shown (1 of 4)]
[im 1/15]
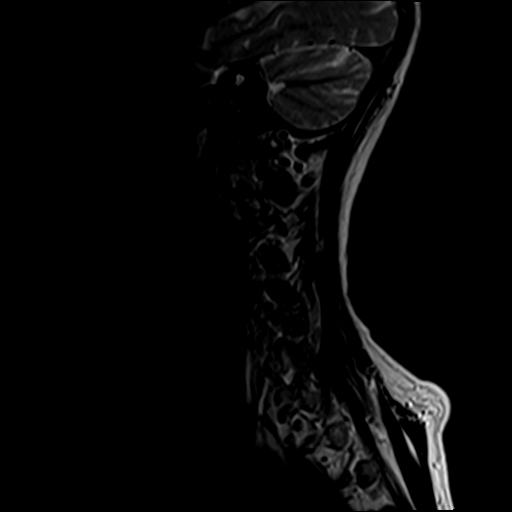
[im 3/15]
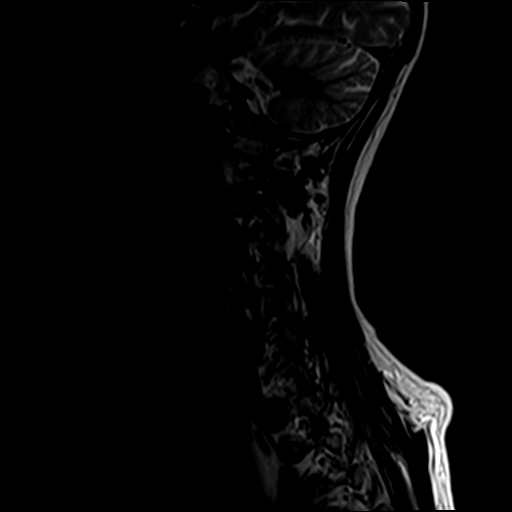
[im 6/15]
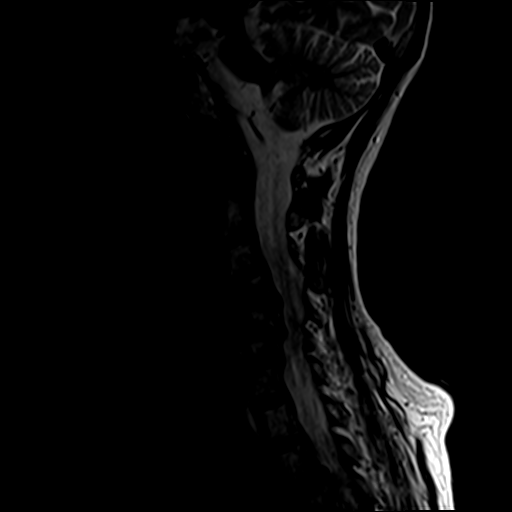
[im 9/15]
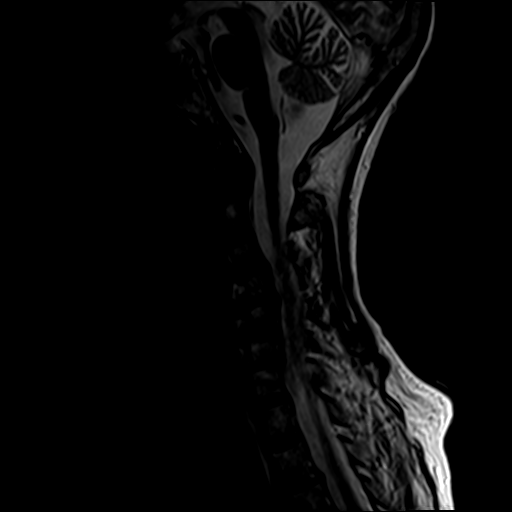
[im 12/15]
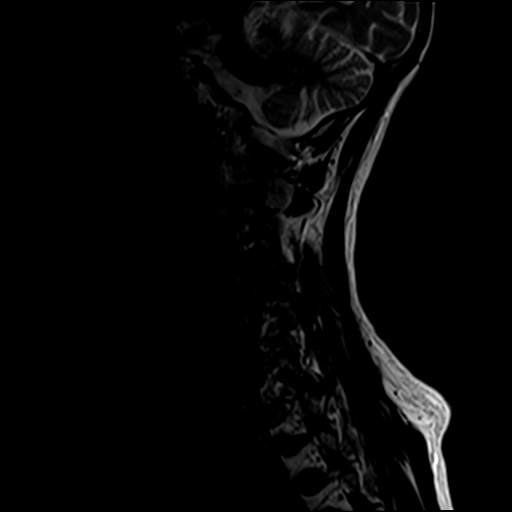
[im 15/15]
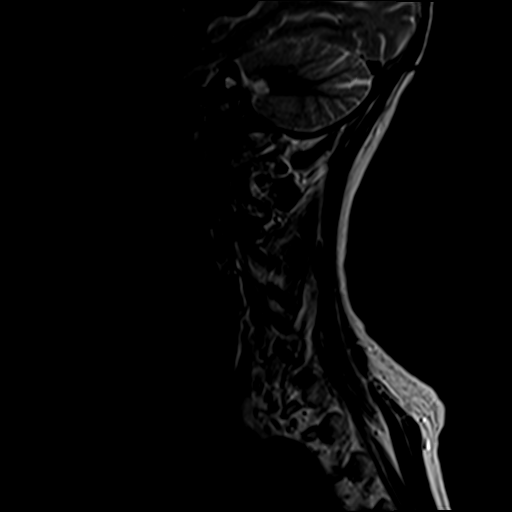

[Series 6: T2 · sagittal · 3.0mm · 0.41mm/px · 6 of 16 slices shown (2 of 4)]
[im 1/16]
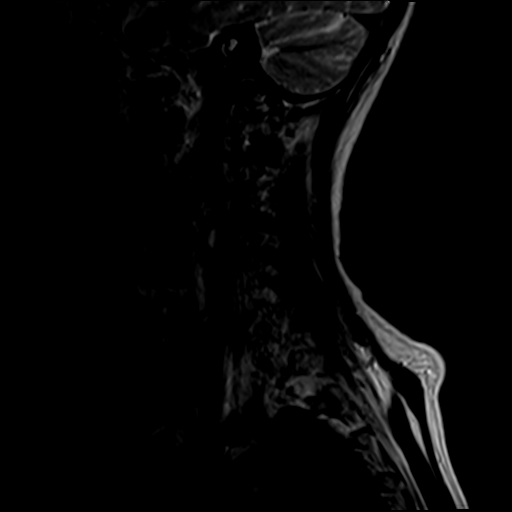
[im 4/16]
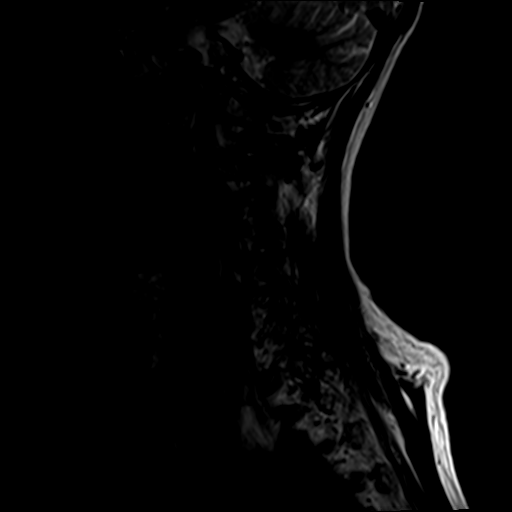
[im 7/16]
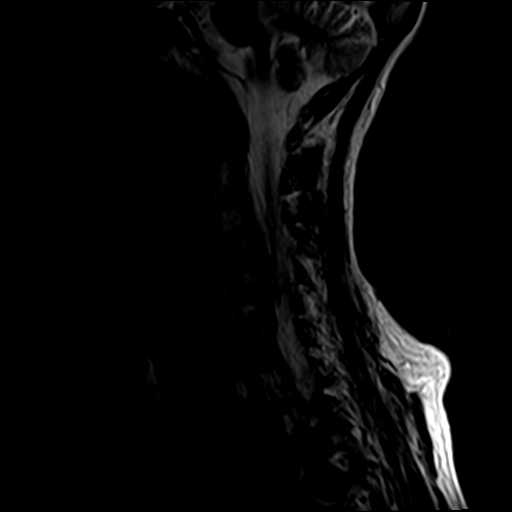
[im 10/16]
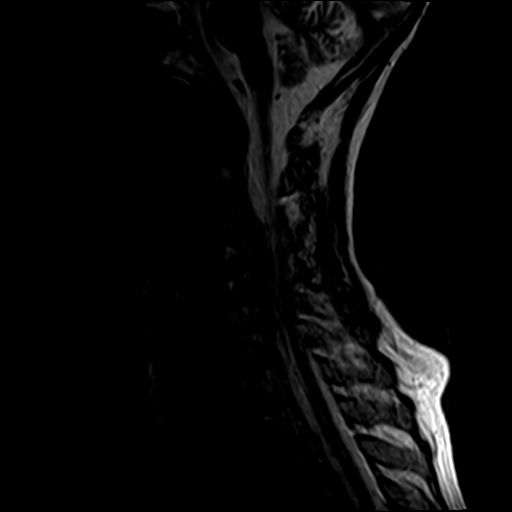
[im 13/16]
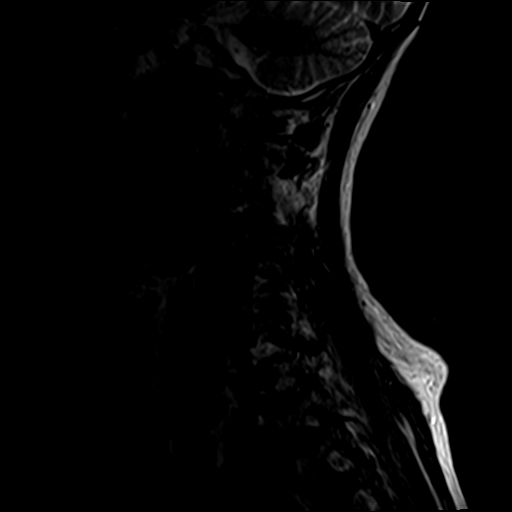
[im 16/16]
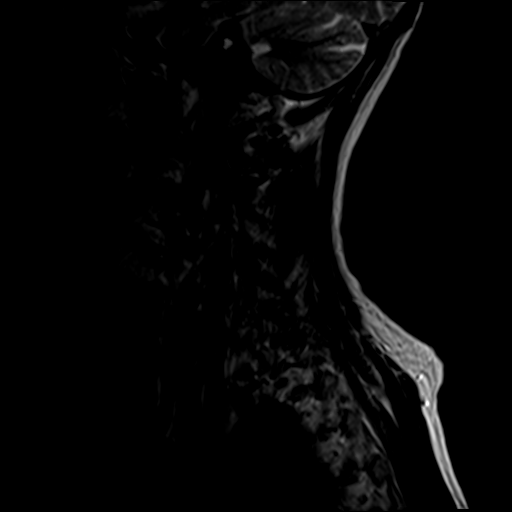

[Series 9: T2 · axial · 3.0mm · 0.39mm/px · z∈[-10,+61]mm · 6 of 23 slices shown (3 of 4)]
[im 1/23]
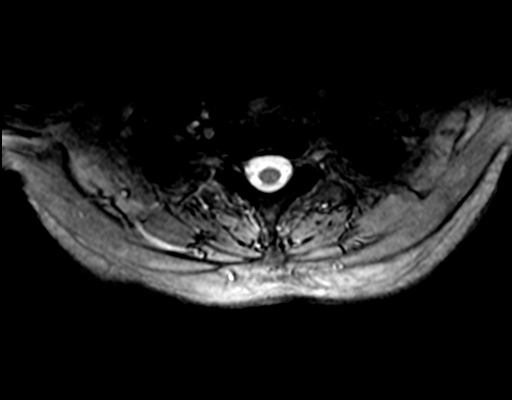
[im 3/23]
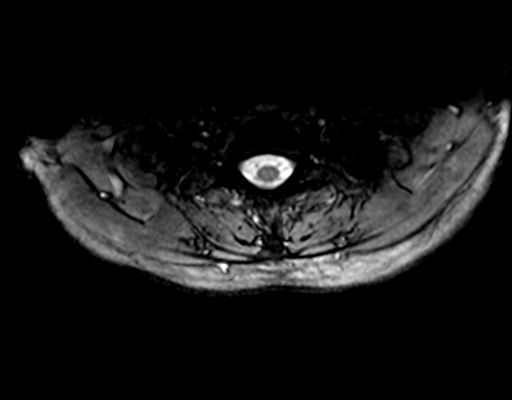
[im 6/23]
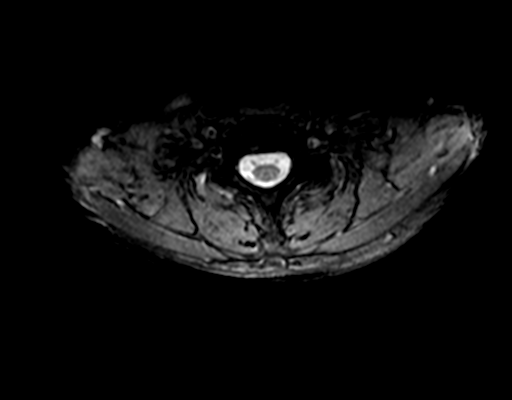
[im 9/23]
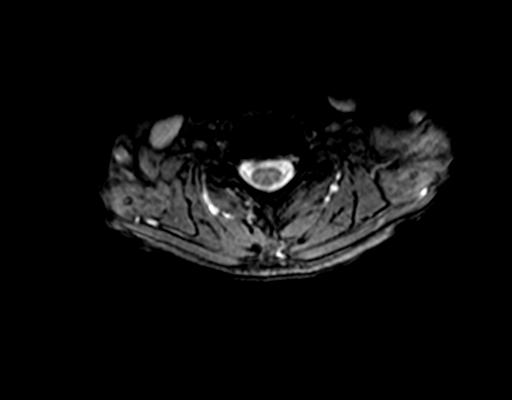
[im 12/23]
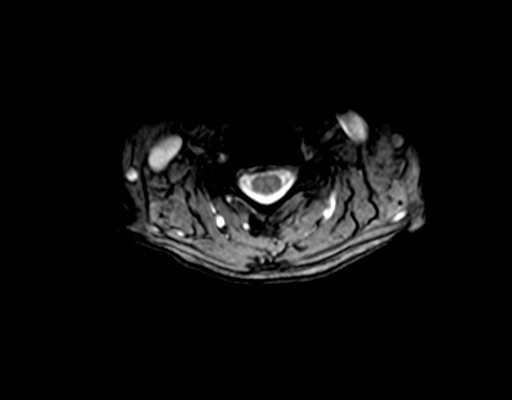
[im 20/23]
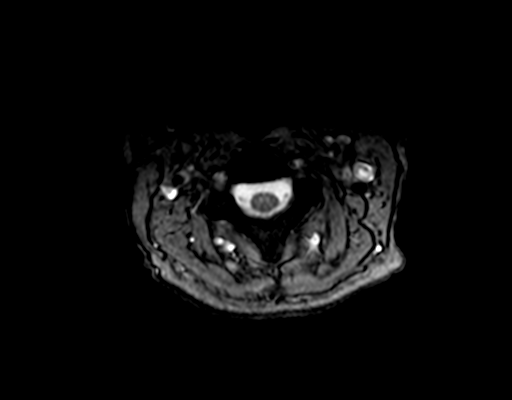

[Series 10: T2 · axial · 3.0mm · 0.39mm/px · z∈[-3,+60]mm · 3 of 23 slices shown (4 of 4)]
[im 3/23]
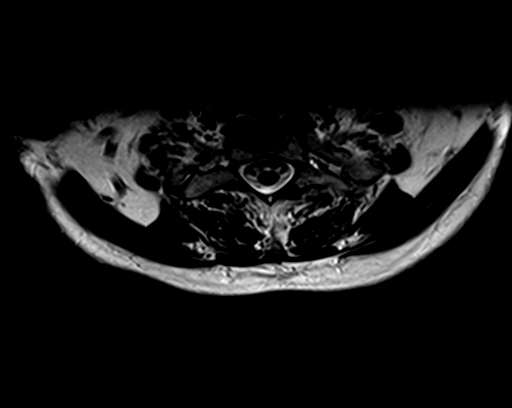
[im 12/23]
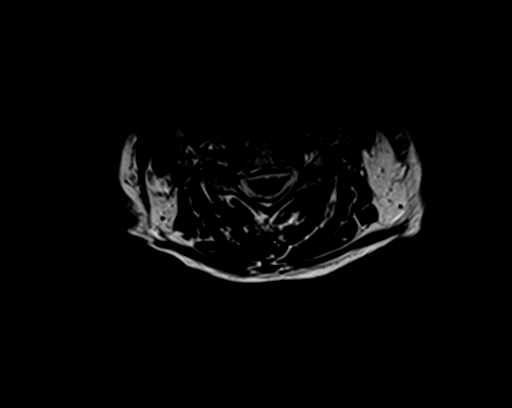
[im 20/23]
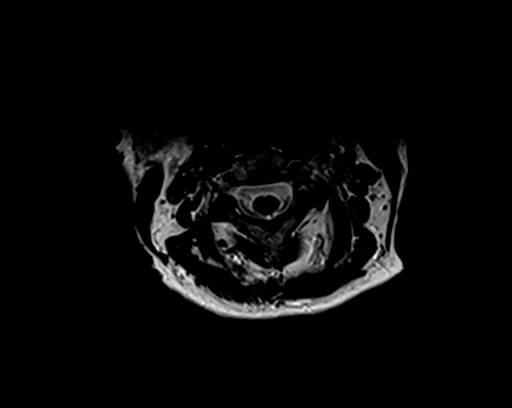

[21 of 48 positions shown; findings below may reference images not displayed]

FINDINGS: Alignment: 3 mm anterolisthesis C3-4. Straightening of the cervical
lordosis with mild kyphosis.

Vertebrae: Negative for fracture or mass.

Cord: Cord evaluation limited by patient motion during the study.
Allowing for this, no cord lesion identified.

Posterior Fossa, vertebral arteries, paraspinal tissues: Negative

Disc levels:

C2-3: Negative

C3-4: 3 mm anterolisthesis. Disc and facet degeneration. Mild
foraminal narrowing bilaterally. Moderate left foraminal narrowing
due to spurring.

C4-5: Disc degeneration and spondylosis. Diffuse uncinate spurring.
Mild spinal stenosis and moderate foraminal stenosis bilaterally.

C5-6: Disc degeneration and spondylosis. Moderate right foraminal
narrowing and mild left foraminal narrowing due to spurring

C6-7: Disc degeneration and spondylosis. Moderate left foraminal
narrowing due to spurring. Mild right foraminal narrowing

C7-T1: Mild disc and mild facet degeneration. Mild foraminal
narrowing bilaterally.
IMPRESSION: Motion degraded study

Multilevel degenerative change throughout the cervical spine causing
spinal and foraminal stenosis as above.

## 2020-10-02 ENCOUNTER — Other Ambulatory Visit: Payer: Self-pay

## 2020-10-02 ENCOUNTER — Ambulatory Visit: Payer: PPO | Admitting: General Surgery

## 2020-10-02 ENCOUNTER — Encounter: Payer: Self-pay | Admitting: General Surgery

## 2020-10-02 VITALS — BP 164/103 | HR 68 | Temp 98.4°F | Resp 14 | Ht 62.0 in | Wt 111.0 lb

## 2020-10-02 DIAGNOSIS — K432 Incisional hernia without obstruction or gangrene: Secondary | ICD-10-CM | POA: Insufficient documentation

## 2020-10-02 NOTE — Patient Instructions (Addendum)
Ventral Hernia  A ventral hernia is a bulge of tissue from inside the abdomen that pushes through a weak area of the muscles that form the front wall of the abdomen. The tissues inside the abdomen are inside a sac (peritoneum). These tissues include the small intestine, large intestine, and the fatty tissue that covers the intestines (omentum). Sometimes, the bulge that forms a hernia contains intestines. Other hernias contain only fat. Ventral hernias do not go away without surgical treatment. There are several types of ventral hernias. You may have:  A hernia at an incision site from previous abdominal surgery (incisional hernia).  A hernia just above the belly button (epigastric hernia), or at the belly button (umbilical hernia). These types of hernias can develop from heavy lifting or straining.  A hernia that comes and goes (reducible hernia). It may be visible only when you lift or strain. This type of hernia can be pushed back into the abdomen (reduced).  A hernia that traps abdominal tissue inside the hernia (incarcerated hernia). This type of hernia does not reduce.  A hernia that cuts off blood flow to the tissues inside the hernia (strangulated hernia). The tissues can start to die if this happens. This is a very painful bulge that cannot be reduced. A strangulated hernia is a medical emergency. What are the causes? This condition is caused by abdominal tissue putting pressure on an area of weakness in the abdominal muscles. What increases the risk? The following factors may make you more likely to develop this condition:  Being age 26 or older.  Being overweight or obese.  Having had previous abdominal surgery, especially if there was an infection after surgery.  Having had an injury to the abdominal wall.  Frequently lifting or pushing heavy objects.  Having had several pregnancies.  Having a buildup of fluid inside the abdomen (ascites).  Straining to have a bowel  movement or to urinate.  Having frequent coughing episodes. What are the signs or symptoms? The only symptom of a ventral hernia may be a painless bulge in the abdomen. A reducible hernia may be visible only when you strain, cough, or lift. Other symptoms may include:  Dull pain.  A feeling of pressure. Signs and symptoms of a strangulated hernia may include:  Increasing pain.  Nausea and vomiting.  Pain when pressing on the hernia.  The skin over the hernia turning red or purple.  Constipation.  Blood in the stool (feces). How is this diagnosed? This condition may be diagnosed based on:  Your symptoms.  Your medical history.  A physical exam. You may be asked to cough or strain while standing. These actions increase the pressure inside your abdomen and force the hernia through the opening in your muscles. Your health care provider may try to reduce the hernia by gently pushing the hernia back in.  Imaging studies, such as an ultrasound or CT scan. How is this treated? This condition is treated with surgery. If you have a strangulated hernia, surgery is done as soon as possible. If your hernia is small and not incarcerated, you may be asked to lose some weight before surgery. Follow these instructions at home:  Follow instructions from your health care provider about eating or drinking restrictions.  If you are overweight, your health care provider may recommend that you increase your activity level and eat a healthier diet.  Do not lift anything that is heavier than 10 lb (4.5 kg), or the limit that you are told,  until your health care provider says that it is safe.  Return to your normal activities as told by your health care provider. Ask your health care provider what activities are safe for you. You may need to avoid activities that increase pressure on your hernia.  Take over-the-counter and prescription medicines only as told by your health care provider.  Keep  all follow-up visits. This is important. Contact a health care provider if:  Your hernia gets larger.  Your hernia becomes painful. Get help right away if:  Your hernia becomes increasingly painful.  You have pain along with any of the following: ? Changes in skin color in the area of the hernia. ? Nausea. ? Vomiting. ? Fever. These symptoms may represent a serious problem that is an emergency. Do not wait to see if the symptoms will go away. Get medical help right away. Call your local emergency services (911 in the [place] Do not drive yourself to the hospital. Summary  A ventral hernia is a bulge of tissue from inside the abdomen that pushes through a weak area of the muscles that form the front wall of the abdomen.  This condition is treated with surgery, which may be urgent depending on your hernia.  Do not lift anything that is heavier than 10 lb (4.5 kg), and follow activity instructions from your health care provider. This information is not intended to replace advice given to you by your health care provider. Make sure you discuss any questions you have with your health care provider. Document Revised: 01/11/2020 Document Reviewed: 01/11/2020 Elsevier Patient Education  2021 [employer]   Open Hernia Repair, Adult Open hernia repair is a surgical procedure to fix a hernia. A hernia occurs when an internal organ or tissue pushes through a weak spot in the muscles along the wall of the abdomen. Hernias commonly occur in the groin and around the belly button. Most hernias tend to get worse over time. Often, surgery is done to prevent the hernia from becoming bigger, uncomfortable, or an emergency. Emergency surgery may be needed if contents of the abdomen get stuck in the opening (incarcerated hernia) or if the blood supply gets cut off (strangulated hernia). In an open repair, an incision is made in the abdomen to perform the surgery. Tell a health care provider about:  Any  allergies you have.  All medicines you are taking, including vitamins, herbs, eye drops, creams, and over-the-counter medicines.  Any problems you or family members have had with anesthetic medicines.  Any blood or bone disorders you have.  Any surgeries you have had.  Any medical conditions you have, including any recent cold or flu (influenza)symptoms.  Whether you are pregnant or may be pregnant. What are the risks? Generally, this is a safe procedure. However, problems may occur, including:  Long-lasting (chronic) pain.  Bleeding.  Infection.  Damage to the testicles. This can cause shrinking or swelling.  Damage to nearby structures or organs, including the bladder, blood vessels, intestines, or nerves near the hernia.  Blood clots.  Trouble passing urine.  Return of the hernia. Medicines Ask your health care provider about:  Changing or stopping your regular medicines. This is especially important if you are taking diabetes medicines or blood thinners.  Taking medicines such as aspirin and ibuprofen. These medicines can thin your blood. Do not take these medicines unless your health care provider tells you to take them.  Taking over-the-counter medicines, vitamins, herbs, and supplements. Surgery safety Ask your health   care provider:  How your surgery site will be marked.  What steps will be taken to help prevent infection. These steps may include: ? Removing hair at the surgery site. ? Washing skin with a germ-killing soap. ? Receiving antibiotic medicine. General instructions  You may have an exam or testing, such as blood tests or imaging studies.  Do not use any products that contain nicotine or tobacco for at least 4 weeks before the procedure. These products include cigarettes, chewing tobacco, and vaping devices, such as e-cigarettes. If you need help quitting, ask your health care provider.  Let your health care provider know if you develop a cold  or any infection before your surgery. If you get an infection before surgery, you may receive antibiotics to treat it.  Plan to have a responsible adult take you home from the hospital or clinic.  If you will be going home right after the procedure, plan to have a responsible adult care for you for the time you are told. This is important. What happens during the procedure?  An IV will be inserted into one of your veins.  You will be given one or more of the following: ? A medicine to help you relax (sedative). ? A medicine to numb the area (local anesthetic). ? A medicine to make you fall asleep (general anesthetic).  Your surgeon will make an incision over the hernia.  The tissues of the hernia will be moved back into place.  The edges of the hernia may be stitched (sutured) together.  The opening in the abdominal muscles will be closed with stitches (sutures). Or, your surgeon will place a mesh patch made of artificial (synthetic) material over the opening.  The incision will be closed with sutures, skin glue, or adhesive strips.  A bandage (dressing) may be placed over the incision. The procedure may vary among health care providers and hospitals.   What happens after the procedure?  Your blood pressure, heart rate, breathing rate, and blood oxygen level will be monitored until you leave the hospital or clinic.  You may be given medicine for pain.  If you were given a sedative during the procedure, it can affect you for several hours. Do not drive or operate machinery until your health care provider says that it is safe. Summary  Open hernia repair is a surgical procedure to fix a hernia. Hernias commonly occur in the groin and around the belly button.  Emergency surgery may be needed if contents of the abdomen get stuck in the opening (incarcerated hernia) or if the blood supply gets cut off (strangulated hernia).  In this procedure, an incision is made in the abdomen to  perform the surgery.  After the procedure, you may be given medicine for pain. This information is not intended to replace advice given to you by your health care provider. Make sure you discuss any questions you have with your health care provider. Document Revised: 01/07/2020 Document Reviewed: 01/07/2020 Elsevier Patient Education  2021 [employer].  
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
## 2020-10-22 NOTE — Patient Instructions (Signed)
Lori [name]  10/22/2020     @PREFPERIOPPHARMACY @   Your procedure is scheduled on  10/24/2020.   Report to [facility] at  [place]  A.[facility].   Call this number if you have problems the morning of surgery:  [phone]   Remember:  Do not eat or drink after midnight.                       Take these medicines the morning of surgery with A SIP OF WATER  Allopurinol, amlodipine.   Place clean sheets on your bed the night before your procedure and DO NOT sleep with pets this night.  Shower with CHG the night before and the morning of your procedure and DO NOT use CHG on your face, hair or genitals.  After each shower dry off with a clean towel, put on clean, comfortable clothes and brush your teeth.      Do not wear jewelry, make-up or nail polish.  Do not wear lotions, powders, or perfumes, or deodorant.  Do not shave 48 hours prior to surgery.  Men may shave face and neck.  Do not bring valuables to the hospital.  [facility] is not responsible for any belongings or valuables.  Contacts, dentures or bridgework may not be worn into surgery.  Leave your suitcase in the car.  After surgery it may be brought to your room.  For patients admitted to the hospital, discharge time will be determined by your treatment team.  Patients discharged the day of surgery will not be allowed to drive home and must have someone with them for 24 hours.    Special instructions:  DO NOT smoke tobacco or vape for 24 hours before your procedure.  Please read over the following fact sheets that you were given. Coughing and Deep Breathing, Surgical Site Infection Prevention, Anesthesia Post-op Instructions and Care and Recovery After Surgery       Open Hernia Repair, Adult, Care After What can I expect after the procedure? After the procedure, it is common to have:  Mild discomfort.  Slight bruising.  Mild swelling.  Pain in the belly (abdomen).  A small amount of blood from  the cut from surgery (incision). Follow these instructions at home: Your doctor may give you more specific instructions. If you have problems, call your doctor. Medicines  Take over-the-counter and prescription medicines only as told by your doctor.  If told, take steps to prevent problems with pooping (constipation). You may need to: ? Drink enough fluid to keep your pee (urine) pale yellow. ? Take medicines. You will be told what medicines to take. ? Eat foods that are high in fiber. These include beans, whole grains, and fresh fruits and vegetables. ? Limit foods that are high in fat and sugar. These include fried or sweet foods.  Ask your doctor if you should avoid driving or using machines while you are taking your medicine. Incision care  Follow instructions from your doctor about how to take care of your incision. Make sure you: ? Wash your hands with soap and water for at least 20 seconds before and after you change your bandage (dressing). If you cannot use soap and water, use hand sanitizer. ? Change your bandage. ? Leave stitches or skin glue in place for at least 2 weeks. ? Leave tape strips alone unless you are told to take them off. You may trim the edges of the tape  strips if they curl up.  Check your incision every day for signs of infection. Check for: ? More redness, swelling, or pain. ? More fluid or blood. ? Warmth. ? Pus or a bad smell.  Wear loose, soft clothing while your incision heals.   Activity  Rest as told by your doctor.  Do not lift anything that is heavier than 10 lb (4.5 kg), or the limit that you are told.  Do not play contact sports until your doctor says that this is safe.  If you were given a sedative during your procedure, do not drive or use machines until your doctor says that it is safe. A sedative is a medicine that helps you relax.  Return to your normal activities when your doctor says that it is safe.   General instructions  Do  not take baths, swim, or use a hot tub. Ask your doctor about taking showers or sponge baths.  Hold a pillow over your belly when you cough or sneeze. This helps with pain.  Do not smoke or use any products that contain nicotine or tobacco. If you need help quitting, ask your doctor.  Keep all follow-up visits. Contact a doctor if:  You have any of these signs of infection in or around your incision: ? More redness, swelling, or pain. ? More fluid or blood. ? Warmth. ? Pus. ? A bad smell.  You have a fever or chills.  You have blood in your poop (stool).  You have not pooped (had a bowel movement) in 2-3 days.  Medicine does not help your pain. Get help right away if:  You have chest pain, or you are short of breath.  You feel faint or light-headed.  You have very bad pain.  You vomit and your pain is worse.  You have pain, swelling, or redness in a leg. These symptoms may be an emergency. Get help right away. Call your local emergency services (911 in the [place]  Do not wait to see if the symptoms will go away.  Do not drive yourself to the hospital. Summary  After this procedure, it is common to have mild discomfort, slight bruising, and mild swelling.  Follow instructions from your doctor about how to take care of your cut from surgery (incision). Check every day for signs of infection.  Do not lift heavy objects or play contact sports until your doctor says it is safe.  Return to your normal activities as told by your doctor. This information is not intended to replace advice given to you by your health care provider. Make sure you discuss any questions you have with your health care provider. Document Revised: 01/07/2020 Document Reviewed: 01/07/2020 Elsevier Patient Education  2021 [place] Anesthesia, Adult, Care After This sheet gives you information about how to care for yourself after your procedure. Your health care provider may also give  you more specific instructions. If you have problems or questions, contact your health care provider. What can I expect after the procedure? After the procedure, the following side effects are common:  Pain or discomfort at the IV site.  Nausea.  Vomiting.  Sore throat.  Trouble concentrating.  Feeling cold or chills.  Feeling weak or tired.  Sleepiness and fatigue.  Soreness and body aches. These side effects can affect parts of the body that were not involved in surgery. Follow these instructions at home: For the time period you were told by your health care provider:  Rest.  Do not participate in activities where you could fall or become injured.  Do not drive or use machinery.  Do not drink alcohol.  Do not take sleeping pills or medicines that cause drowsiness.  Do not make important decisions or sign legal documents.  Do not take care of children on your own.   Eating and drinking  Follow any instructions from your health care provider about eating or drinking restrictions.  When you feel hungry, start by eating small amounts of foods that are soft and easy to digest (bland), such as toast. Gradually return to your regular diet.  Drink enough fluid to keep your urine pale yellow.  If you vomit, rehydrate by drinking water, juice, or clear broth. General instructions  If you have sleep apnea, surgery and certain medicines can increase your risk for breathing problems. Follow instructions from your health care provider about wearing your sleep device: ? Anytime you are sleeping, including during daytime naps. ? While taking prescription pain medicines, sleeping medicines, or medicines that make you drowsy.  Have a responsible adult stay with you for the time you are told. It is important to have someone help care for you until you are awake and alert.  Return to your normal activities as told by your health care provider. Ask your health care provider what  activities are safe for you.  Take over-the-counter and prescription medicines only as told by your health care provider.  If you smoke, do not smoke without supervision.  Keep all follow-up visits as told by your health care provider. This is important. Contact a health care provider if:  You have nausea or vomiting that does not get better with medicine.  You cannot eat or drink without vomiting.  You have pain that does not get better with medicine.  You are unable to pass urine.  You develop a skin rash.  You have a fever.  You have redness around your IV site that gets worse. Get help right away if:  You have difficulty breathing.  You have chest pain.  You have blood in your urine or stool, or you vomit blood. Summary  After the procedure, it is common to have a sore throat or nausea. It is also common to feel tired.  Have a responsible adult stay with you for the time you are told. It is important to have someone help care for you until you are awake and alert.  When you feel hungry, start by eating small amounts of foods that are soft and easy to digest (bland), such as toast. Gradually return to your regular diet.  Drink enough fluid to keep your urine pale yellow.  Return to your normal activities as told by your health care provider. Ask your health care provider what activities are safe for you. This information is not intended to replace advice given to you by your health care provider. Make sure you discuss any questions you have with your health care provider. Document Revised: 02/07/2020 Document Reviewed: 09/06/2019 Elsevier Patient Education  2021 [employer].

## 2020-10-23 ENCOUNTER — Other Ambulatory Visit (HOSPITAL_COMMUNITY): Payer: PPO

## 2020-10-23 ENCOUNTER — Encounter (HOSPITAL_COMMUNITY)
Admission: RE | Admit: 2020-10-23 | Discharge: 2020-10-23 | Disposition: A | Discharge status: Home / Self Care | Payer: PPO | Source: Ambulatory Visit | Attending: General Surgery | Admitting: General Surgery

## 2020-10-24 DIAGNOSIS — K432 Incisional hernia without obstruction or gangrene: Secondary | ICD-10-CM | POA: Diagnosis present

## 2020-11-12 NOTE — Patient Instructions (Addendum)
Your procedure is scheduled on: 11/19/2020  Report to [place] [facility] at  6:15   AM.  Call this number if you have problems the morning of surgery: [phone]   Remember:   Do not Eat or Drink after midnight         No Smoking the morning of surgery  :  Take these medicines the morning of surgery with A SIP OF WATER: Amlodipine   Do not wear jewelry, make-up or nail polish.  Do not wear lotions, powders, or perfumes. You may wear deodorant.  Do not shave 48 hours prior to surgery. Men may shave face and neck.  Do not bring valuables to the hospital.  Contacts, dentures or bridgework may not be worn into surgery.  Leave suitcase in the car. After surgery it may be brought to your room.  For patients admitted to the hospital, checkout time is 11:00 AM the day of discharge.   Patients discharged the day of surgery will not be allowed to drive home.    Special Instructions: Shower using CHG night before surgery and shower the day of surgery use CHG.  Use special wash - you have one bottle of CHG for all showers.  You should use approximately 1/2 of the bottle for each shower.  How to Use Chlorhexidine for Bathing Chlorhexidine gluconate (CHG) is a germ-killing (antiseptic) solution that is used to clean the skin. It can get rid of the bacteria that normally live on the skin and can keep them away for about 24 hours. To clean your skin with CHG, you may be given:  A CHG solution to use in the shower or as part of a sponge bath.  A prepackaged cloth that contains CHG. Cleaning your skin with CHG may help lower the risk for infection:  While you are staying in the intensive care unit of the hospital.  If you have a vascular access, such as a central line, to provide short-term or long-term access to your veins.  If you have a catheter to drain urine from your bladder.  If you are on a ventilator. A ventilator is a machine that helps you breathe by moving air in and out of  your lungs.  After surgery. What are the risks? Risks of using CHG include:  A skin reaction.  Hearing loss, if CHG gets in your ears.  Eye injury, if CHG gets in your eyes and is not rinsed out.  The CHG product catching fire. Make sure that you avoid smoking and flames after applying CHG to your skin. Do not use CHG:  If you have a chlorhexidine allergy or have previously reacted to chlorhexidine.  On babies younger than 68 months of age. How to use CHG solution  Use CHG only as told by your health care provider, and follow the instructions on the label.  Use the full amount of CHG as directed. Usually, this is one bottle. During a shower Follow these steps when using CHG solution during a shower (unless your health care provider gives you different instructions): 1. Start the shower. 2. Use your normal soap and shampoo to wash your face and hair. 3. Turn off the shower or move out of the shower stream. 4. Pour the CHG onto a clean washcloth. Do not use any type of brush or rough-edged sponge. 5. Starting at your neck, lather your body down to your toes. Make sure you follow these instructions: ? If you will be having surgery, pay  special attention to the part of your body where you will be having surgery. Scrub this area for at least 1 minute. ? Do not use CHG on your head or face. If the solution gets into your ears or eyes, rinse them well with water. ? Avoid your genital area. ? Avoid any areas of skin that have broken skin, cuts, or scrapes. ? Scrub your back and under your arms. Make sure to wash skin folds. 6. Let the lather sit on your skin for 1-2 minutes or as long as told by your health care provider. 7. Thoroughly rinse your entire body in the shower. Make sure that all body creases and crevices are rinsed well. 8. Dry off with a clean towel. Do not put any substances on your body afterward--such as powder, lotion, or perfume--unless you are told to do so by your  health care provider. Only use lotions that are recommended by the manufacturer. 9. Put on clean clothes or pajamas. 10. If it is the night before your surgery, sleep in clean sheets.   During a sponge bath Follow these steps when using CHG solution during a sponge bath (unless your health care provider gives you different instructions): 1. Use your normal soap and shampoo to wash your face and hair. 2. Pour the CHG onto a clean washcloth. 3. Starting at your neck, lather your body down to your toes. Make sure you follow these instructions: ? If you will be having surgery, pay special attention to the part of your body where you will be having surgery. Scrub this area for at least 1 minute. ? Do not use CHG on your head or face. If the solution gets into your ears or eyes, rinse them well with water. ? Avoid your genital area. ? Avoid any areas of skin that have broken skin, cuts, or scrapes. ? Scrub your back and under your arms. Make sure to wash skin folds. 4. Let the lather sit on your skin for 1-2 minutes or as long as told by your health care provider. 5. Using a different clean, wet washcloth, thoroughly rinse your entire body. Make sure that all body creases and crevices are rinsed well. 6. Dry off with a clean towel. Do not put any substances on your body afterward--such as powder, lotion, or perfume--unless you are told to do so by your health care provider. Only use lotions that are recommended by the manufacturer. 7. Put on clean clothes or pajamas. 8. If it is the night before your surgery, sleep in clean sheets. How to use CHG prepackaged cloths  Only use CHG cloths as told by your health care provider, and follow the instructions on the label.  Use the CHG cloth on clean, dry skin.  Do not use the CHG cloth on your head or face unless your health care provider tells you to.  When washing with the CHG cloth: ? Avoid your genital area. ? Avoid any areas of skin that have  broken skin, cuts, or scrapes. Before surgery Follow these steps when using a CHG cloth to clean before surgery (unless your health care provider gives you different instructions): 1. Using the CHG cloth, vigorously scrub the part of your body where you will be having surgery. Scrub using a back-and-forth motion for 3 minutes. The area on your body should be completely wet with CHG when you are done scrubbing. 2. Do not rinse. Discard the cloth and let the area air-dry. Do not put any substances on  the area afterward, such as powder, lotion, or perfume. 3. Put on clean clothes or pajamas. 4. If it is the night before your surgery, sleep in clean sheets.   For general bathing Follow these steps when using CHG cloths for general bathing (unless your health care provider gives you different instructions). 1. Use a separate CHG cloth for each area of your body. Make sure you wash between any folds of skin and between your fingers and toes. Wash your body in the following order, switching to a new cloth after each step: ? The front of your neck, shoulders, and chest. ? Both of your arms, under your arms, and your hands. ? Your stomach and groin area, avoiding the genitals. ? Your right leg and foot. ? Your left leg and foot. ? The back of your neck, your back, and your buttocks. 2. Do not rinse. Discard the cloth and let the area air-dry. Do not put any substances on your body afterward--such as powder, lotion, or perfume--unless you are told to do so by your health care provider. Only use lotions that are recommended by the manufacturer. 3. Put on clean clothes or pajamas. Contact a health care provider if:  Your skin gets irritated after scrubbing.  You have questions about using your solution or cloth. Get help right away if:  Your eyes become very red or swollen.  Your eyes itch badly.  Your skin itches badly and is red or swollen.  Your hearing changes.  You have trouble  seeing.  You have swelling or tingling in your mouth or throat.  You have trouble breathing.  You swallow any chlorhexidine. Summary  Chlorhexidine gluconate (CHG) is a germ-killing (antiseptic) solution that is used to clean the skin. Cleaning your skin with CHG may help to lower your risk for infection.  You may be given CHG to use for bathing. It may be in a bottle or in a prepackaged cloth to use on your skin. Carefully follow your health care provider's instructions and the instructions on the product label.  Do not use CHG if you have a chlorhexidine allergy.  Contact your health care provider if your skin gets irritated after scrubbing. This information is not intended to replace advice given to you by your health care provider. Make sure you discuss any questions you have with your health care provider. Document Revised: 11/09/2019 Document Reviewed: 11/09/2019 Elsevier Patient Education  2021 [place] Rep[facility], Adult, Care After What can I expect after the procedure? After the procedure, it is common to have:  Mild discomfort.  Slight bruising.  Mild swelling.  Pain in the belly (abdomen).  A small amount of blood from the cut from surgery (incision). Follow these instructions at home: Your doctor may give you more specific instructions. If you have problems, call your doctor. Medicines  Take over-the-counter and prescription medicines only as told by your doctor.  If told, take steps to prevent problems with pooping (constipation). You may need to: ? Drink enough fluid to keep your pee (urine) pale yellow. ? Take medicines. You will be told what medicines to take. ? Eat foods that are high in fiber. These include beans, whole grains, and fresh fruits and vegetables. ? Limit foods that are high in fat and sugar. These include fried or sweet foods.  Ask your doctor if you should avoid driving or using machines while you are taking your  medicine. Incision care  Follow instructions from your doctor about how  to take care of your incision. Make sure you: ? Wash your hands with soap and water for at least 20 seconds before and after you change your bandage (dressing). If you cannot use soap and water, use hand sanitizer. ? Change your bandage. ? Leave stitches or skin glue in place for at least 2 weeks. ? Leave tape strips alone unless you are told to take them off. You may trim the edges of the tape strips if they curl up.  Check your incision every day for signs of infection. Check for: ? More redness, swelling, or pain. ? More fluid or blood. ? Warmth. ? Pus or a bad smell.  Wear loose, soft clothing while your incision heals.   Activity  Rest as told by your doctor.  Do not lift anything that is heavier than 10 lb (4.5 kg), or the limit that you are told.  Do not play contact sports until your doctor says that this is safe.  If you were given a sedative during your procedure, do not drive or use machines until your doctor says that it is safe. A sedative is a medicine that helps you relax.  Return to your normal activities when your doctor says that it is safe.   General instructions  Do not take baths, swim, or use a hot tub. Ask your doctor about taking showers or sponge baths.  Hold a pillow over your belly when you cough or sneeze. This helps with pain.  Do not smoke or use any products that contain nicotine or tobacco. If you need help quitting, ask your doctor.  Keep all follow-up visits. Contact a doctor if:  You have any of these signs of infection in or around your incision: ? More redness, swelling, or pain. ? More fluid or blood. ? Warmth. ? Pus. ? A bad smell.  You have a fever or chills.  You have blood in your poop (stool).  You have not pooped (had a bowel movement) in 2-3 days.  Medicine does not help your pain. Get help right away if:  You have chest pain, or you are short of  breath.  You feel faint or light-headed.  You have very bad pain.  You vomit and your pain is worse.  You have pain, swelling, or redness in a leg. These symptoms may be an emergency. Get help right away. Call your local emergency services (911 in the [place]  Do not wait to see if the symptoms will go away.  Do not drive yourself to the hospital. Summary  After this procedure, it is common to have mild discomfort, slight bruising, and mild swelling.  Follow instructions from your doctor about how to take care of your cut from surgery (incision). Check every day for signs of infection.  Do not lift heavy objects or play contact sports until your doctor says it is safe.  Return to your normal activities as told by your doctor. This information is not intended to replace advice given to you by your health care provider. Make sure you discuss any questions you have with your health care provider. Document Revised: 01/07/2020 Document Reviewed: 01/07/2020 Elsevier Patient Education  2021 [place] Anesthesia, Adult, Care After This sheet gives you information about how to care for yourself after your procedure. Your health care provider may also give you more specific instructions. If you have problems or questions, contact your health care provider. What can I expect after the procedure? After the procedure,  the following side effects are common:  Pain or discomfort at the IV site.  Nausea.  Vomiting.  Sore throat.  Trouble concentrating.  Feeling cold or chills.  Feeling weak or tired.  Sleepiness and fatigue.  Soreness and body aches. These side effects can affect parts of the body that were not involved in surgery. Follow these instructions at home: For the time period you were told by your health care provider:  Rest.  Do not participate in activities where you could fall or become injured.  Do not drive or use machinery.  Do not drink  alcohol.  Do not take sleeping pills or medicines that cause drowsiness.  Do not make important decisions or sign legal documents.  Do not take care of children on your own.   Eating and drinking  Follow any instructions from your health care provider about eating or drinking restrictions.  When you feel hungry, start by eating small amounts of foods that are soft and easy to digest (bland), such as toast. Gradually return to your regular diet.  Drink enough fluid to keep your urine pale yellow.  If you vomit, rehydrate by drinking water, juice, or clear broth. General instructions  If you have sleep apnea, surgery and certain medicines can increase your risk for breathing problems. Follow instructions from your health care provider about wearing your sleep device: ? Anytime you are sleeping, including during daytime naps. ? While taking prescription pain medicines, sleeping medicines, or medicines that make you drowsy.  Have a responsible adult stay with you for the time you are told. It is important to have someone help care for you until you are awake and alert.  Return to your normal activities as told by your health care provider. Ask your health care provider what activities are safe for you.  Take over-the-counter and prescription medicines only as told by your health care provider.  If you smoke, do not smoke without supervision.  Keep all follow-up visits as told by your health care provider. This is important. Contact a health care provider if:  You have nausea or vomiting that does not get better with medicine.  You cannot eat or drink without vomiting.  You have pain that does not get better with medicine.  You are unable to pass urine.  You develop a skin rash.  You have a fever.  You have redness around your IV site that gets worse. Get help right away if:  You have difficulty breathing.  You have chest pain.  You have blood in your urine or stool, or  you vomit blood. Summary  After the procedure, it is common to have a sore throat or nausea. It is also common to feel tired.  Have a responsible adult stay with you for the time you are told. It is important to have someone help care for you until you are awake and alert.  When you feel hungry, start by eating small amounts of foods that are soft and easy to digest (bland), such as toast. Gradually return to your regular diet.  Drink enough fluid to keep your urine pale yellow.  Return to your normal activities as told by your health care provider. Ask your health care provider what activities are safe for you. This information is not intended to replace advice given to you by your health care provider. Make sure you discuss any questions you have with your health care provider. Document Revised: 02/07/2020 Document Reviewed: 09/06/2019 Elsevier Patient Education  [place].

## 2020-11-17 ENCOUNTER — Encounter (HOSPITAL_COMMUNITY): Payer: Self-pay

## 2020-11-17 ENCOUNTER — Other Ambulatory Visit (HOSPITAL_COMMUNITY): Payer: PPO | Attending: General Surgery

## 2020-11-17 ENCOUNTER — Encounter (HOSPITAL_COMMUNITY)
Admission: RE | Admit: 2020-11-17 | Discharge: 2020-11-17 | Disposition: A | Discharge status: Home / Self Care | Payer: PPO | Source: Ambulatory Visit | Attending: General Surgery | Admitting: General Surgery

## 2020-11-17 NOTE — Pre-Procedure Instructions (Signed)
Secure message sent to Netty Starring at [facility] and voicemail was left as well, to let them know patient was a no show for her PAT today.

## 2020-11-19 DIAGNOSIS — K432 Incisional hernia without obstruction or gangrene: Secondary | ICD-10-CM | POA: Diagnosis present

## 2020-12-10 NOTE — Patient Instructions (Signed)
Lori [name]  12/10/2020     @PREFPERIOPPHARMACY @   Your procedure is scheduled on  12/15/2020.   Report to [facility] at  [phone] A.M.   Call this number if you have problems the morning of surgery:  [phone]   Remember:  Do not eat or drink after midnight.      Take these medicines the morning of surgery with A SIP OF WATER        allopurinol, amlodipine.     Do not wear jewelry, make-up or nail polish.  Do not wear lotions, powders, or perfumes, or deodorant.  Do not shave 48 hours prior to surgery.  Men may shave face and neck.  Do not bring valuables to the hospital.  [facility] is not responsible for any belongings or valuables.  Contacts, dentures or bridgework may not be worn into surgery.  Leave your suitcase in the car.  After surgery it may be brought to your room.  For patients admitted to the hospital, discharge time will be determined by your treatment team.  Patients discharged the day of surgery will not be allowed to drive home and must have someone with them for 24 hours.    Special instructions:    DO NOT smoke tobacco or vape for 24 hours before your procedure.  Please read over the following fact sheets that you were given. Coughing and Deep Breathing, Surgical Site Infection Prevention, Anesthesia Post-op Instructions, and Care and Recovery After Surgery      Open Hernia Repair, Adult, Care After What can I expect after the procedure? After the procedure, it is common to have: Mild discomfort. Slight bruising. Mild swelling. Pain in the belly (abdomen). A small amount of blood from the cut from surgery (incision). Follow these instructions at home: Your doctor may give you more specific instructions. If you have problems, callyour doctor. Medicines Take over-the-counter and prescription medicines only as told by your doctor. If told, take steps to prevent problems with pooping (constipation). You may need to: Drink enough  fluid to keep your pee (urine) pale yellow. Take medicines. You will be told what medicines to take. Eat foods that are high in fiber. These include beans, whole grains, and fresh fruits and vegetables. Limit foods that are high in fat and sugar. These include fried or sweet foods. Ask your doctor if you should avoid driving or using machines while you are taking your medicine. Incision care  Follow instructions from your doctor about how to take care of your incision. Make sure you: Wash your hands with soap and water for at least 20 seconds before and after you change your bandage (dressing). If you cannot use soap and water, use hand sanitizer. Change your bandage. Leave stitches or skin glue in place for at least 2 weeks. Leave tape strips alone unless you are told to take them off. You may trim the edges of the tape strips if they curl up. Check your incision every day for signs of infection. Check for: More redness, swelling, or pain. More fluid or blood. Warmth. Pus or a bad smell. Wear loose, soft clothing while your incision heals.  Activity  Rest as told by your doctor. Do not lift anything that is heavier than 10 lb (4.5 kg), or the limit that you are told. Do not play contact sports until your doctor says that this is safe. If you were given a sedative during your procedure, do not  drive or use machines until your doctor says that it is safe. A sedative is a medicine that helps you relax. Return to your normal activities when your doctor says that it is safe.  General instructions Do not take baths, swim, or use a hot tub. Ask your doctor about taking showers or sponge baths. Hold a pillow over your belly when you cough or sneeze. This helps with pain. Do not smoke or use any products that contain nicotine or tobacco. If you need help quitting, ask your doctor. Keep all follow-up visits. Contact a doctor if: You have any of these signs of infection in or around your  incision: More redness, swelling, or pain. More fluid or blood. Warmth. Pus. A bad smell. You have a fever or chills. You have blood in your poop (stool). You have not pooped (had a bowel movement) in 2-3 days. Medicine does not help your pain. Get help right away if: You have chest pain, or you are short of breath. You feel faint or light-headed. You have very bad pain. You vomit and your pain is worse. You have pain, swelling, or redness in a leg. These symptoms may be an emergency. Get help right away. Call your local emergency services (911 in the [place] Do not wait to see if the symptoms will go away. Do not drive yourself to the hospital. Summary After this procedure, it is common to have mild discomfort, slight bruising, and mild swelling. Follow instructions from your doctor about how to take care of your cut from surgery (incision). Check every day for signs of infection. Do not lift heavy objects or play contact sports until your doctor says it is safe. Return to your normal activities as told by your doctor. This information is not intended to replace advice given to you by your health care provider. Make sure you discuss any questions you have with your healthcare provider. Document Revised: 01/07/2020 Document Reviewed: 01/07/2020 Elsevier Patient Education  2022 [place] Anesthesia, Adult, Care After This sheet gives you information about how to care for yourself after your procedure. Your health care provider may also give you more specific instructions. If you have problems or questions, contact your health careprovider. What can I expect after the procedure? After the procedure, the following side effects are common: Pain or discomfort at the IV site. Nausea. Vomiting. Sore throat. Trouble concentrating. Feeling cold or chills. Feeling weak or tired. Sleepiness and fatigue. Soreness and body aches. These side effects can affect parts of the body  that were not involved in surgery. Follow these instructions at home: For the time period you were told by your health care provider:  Rest. Do not participate in activities where you could fall or become injured. Do not drive or use machinery. Do not drink alcohol. Do not take sleeping pills or medicines that cause drowsiness. Do not make important decisions or sign legal documents. Do not take care of children on your own.  Eating and drinking Follow any instructions from your health care provider about eating or drinking restrictions. When you feel hungry, start by eating small amounts of foods that are soft and easy to digest (bland), such as toast. Gradually return to your regular diet. Drink enough fluid to keep your urine pale yellow. If you vomit, rehydrate by drinking water, juice, or clear broth. General instructions If you have sleep apnea, surgery and certain medicines can increase your risk for breathing problems. Follow instructions from your health care provider  about wearing your sleep device: Anytime you are sleeping, including during daytime naps. While taking prescription pain medicines, sleeping medicines, or medicines that make you drowsy. Have a responsible adult stay with you for the time you are told. It is important to have someone help care for you until you are awake and alert. Return to your normal activities as told by your health care provider. Ask your health care provider what activities are safe for you. Take over-the-counter and prescription medicines only as told by your health care provider. If you smoke, do not smoke without supervision. Keep all follow-up visits as told by your health care provider. This is important. Contact a health care provider if: You have nausea or vomiting that does not get better with medicine. You cannot eat or drink without vomiting. You have pain that does not get better with medicine. You are unable to pass urine. You  develop a skin rash. You have a fever. You have redness around your IV site that gets worse. Get help right away if: You have difficulty breathing. You have chest pain. You have blood in your urine or stool, or you vomit blood. Summary After the procedure, it is common to have a sore throat or nausea. It is also common to feel tired. Have a responsible adult stay with you for the time you are told. It is important to have someone help care for you until you are awake and alert. When you feel hungry, start by eating small amounts of foods that are soft and easy to digest (bland), such as toast. Gradually return to your regular diet. Drink enough fluid to keep your urine pale yellow. Return to your normal activities as told by your health care provider. Ask your health care provider what activities are safe for you. This information is not intended to replace advice given to you by your health care provider. Make sure you discuss any questions you have with your healthcare provider. Document Revised: 02/07/2020 Document Reviewed: 09/06/2019 Elsevier Patient Education  2022 [place]. How to Use Chlorhexidine for Bathing Chlorhexidine gluconate (CHG) is a germ-killing (antiseptic) solution that is used to clean the skin. It can get rid of the bacteria that normally live on the skin and can keep them away for about 24 hours. To clean your skin with CHG, you may be given: A CHG solution to use in the shower or as part of a sponge bath. A prepackaged cloth that contains CHG. Cleaning your skin with CHG may help lower the risk for infection: While you are staying in the intensive care unit of the hospital. If you have a vascular access, such as a central line, to provide short-term or long-term access to your veins. If you have a catheter to drain urine from your bladder. If you are on a ventilator. A ventilator is a machine that helps you breathe by moving air in and out of your lungs. After  surgery. What are the risks? Risks of using CHG include: A skin reaction. Hearing loss, if CHG gets in your ears. Eye injury, if CHG gets in your eyes and is not rinsed out. The CHG product catching fire. Make sure that you avoid smoking and flames after applying CHG to your skin. Do not use CHG: If you have a chlorhexidine allergy or have previously reacted to chlorhexidine. On babies younger than 31 months of age. How to use CHG solution Use CHG only as told by your health care provider, and follow the  instructions on the label. Use the full amount of CHG as directed. Usually, this is one bottle. During a shower Follow these steps when using CHG solution during a shower (unless your health care provider gives you different instructions): Start the shower. Use your normal soap and shampoo to wash your face and hair. Turn off the shower or move out of the shower stream. Pour the CHG onto a clean washcloth. Do not use any type of brush or rough-edged sponge. Starting at your neck, lather your body down to your toes. Make sure you follow these instructions: If you will be having surgery, pay special attention to the part of your body where you will be having surgery. Scrub this area for at least 1 minute. Do not use CHG on your head or face. If the solution gets into your ears or eyes, rinse them well with water. Avoid your genital area. Avoid any areas of skin that have broken skin, cuts, or scrapes. Scrub your back and under your arms. Make sure to wash skin folds. Let the lather sit on your skin for 1-2 minutes or as long as told by your health care provider. Thoroughly rinse your entire body in the shower. Make sure that all body creases and crevices are rinsed well. Dry off with a clean towel. Do not put any substances on your body afterward--such as powder, lotion, or perfume--unless you are told to do so by your health care provider. Only use lotions that are recommended by the  manufacturer. Put on clean clothes or pajamas. If it is the night before your surgery, sleep in clean sheets.  During a sponge bath Follow these steps when using CHG solution during a sponge bath (unless your health care provider gives you different instructions): Use your normal soap and shampoo to wash your face and hair. Pour the CHG onto a clean washcloth. Starting at your neck, lather your body down to your toes. Make sure you follow these instructions: If you will be having surgery, pay special attention to the part of your body where you will be having surgery. Scrub this area for at least 1 minute. Do not use CHG on your head or face. If the solution gets into your ears or eyes, rinse them well with water. Avoid your genital area. Avoid any areas of skin that have broken skin, cuts, or scrapes. Scrub your back and under your arms. Make sure to wash skin folds. Let the lather sit on your skin for 1-2 minutes or as long as told by your health care provider. Using a different clean, wet washcloth, thoroughly rinse your entire body. Make sure that all body creases and crevices are rinsed well. Dry off with a clean towel. Do not put any substances on your body afterward--such as powder, lotion, or perfume--unless you are told to do so by your health care provider. Only use lotions that are recommended by the manufacturer. Put on clean clothes or pajamas. If it is the night before your surgery, sleep in clean sheets. How to use CHG prepackaged cloths Only use CHG cloths as told by your health care provider, and follow the instructions on the label. Use the CHG cloth on clean, dry skin. Do not use the CHG cloth on your head or face unless your health care provider tells you to. When washing with the CHG cloth: Avoid your genital area. Avoid any areas of skin that have broken skin, cuts, or scrapes. Before surgery Follow these steps when using  a CHG cloth to clean before surgery (unless  your health care provider gives you different instructions): Using the CHG cloth, vigorously scrub the part of your body where you will be having surgery. Scrub using a back-and-forth motion for 3 minutes. The area on your body should be completely wet with CHG when you are done scrubbing. Do not rinse. Discard the cloth and let the area air-dry. Do not put any substances on the area afterward, such as powder, lotion, or perfume. Put on clean clothes or pajamas. If it is the night before your surgery, sleep in clean sheets.  For general bathing Follow these steps when using CHG cloths for general bathing (unless your health care provider gives you different instructions). Use a separate CHG cloth for each area of your body. Make sure you wash between any folds of skin and between your fingers and toes. Wash your body in the following order, switching to a new cloth after each step: The front of your neck, shoulders, and chest. Both of your arms, under your arms, and your hands. Your stomach and groin area, avoiding the genitals. Your right leg and foot. Your left leg and foot. The back of your neck, your back, and your buttocks. Do not rinse. Discard the cloth and let the area air-dry. Do not put any substances on your body afterward--such as powder, lotion, or perfume--unless you are told to do so by your health care provider. Only use lotions that are recommended by the manufacturer. Put on clean clothes or pajamas. Contact a health care provider if: Your skin gets irritated after scrubbing. You have questions about using your solution or cloth. Get help right away if: Your eyes become very red or swollen. Your eyes itch badly. Your skin itches badly and is red or swollen. Your hearing changes. You have trouble seeing. You have swelling or tingling in your mouth or throat. You have trouble breathing. You swallow any chlorhexidine. Summary Chlorhexidine gluconate (CHG) is a  germ-killing (antiseptic) solution that is used to clean the skin. Cleaning your skin with CHG may help to lower your risk for infection. You may be given CHG to use for bathing. It may be in a bottle or in a prepackaged cloth to use on your skin. Carefully follow your health care provider's instructions and the instructions on the product label. Do not use CHG if you have a chlorhexidine allergy. Contact your health care provider if your skin gets irritated after scrubbing. This information is not intended to replace advice given to you by your health care provider. Make sure you discuss any questions you have with your healthcare provider. Document Revised: 10/05/2019 Document Reviewed: 11/09/2019 Elsevier Patient Education  [place].

## 2020-12-12 ENCOUNTER — Encounter (HOSPITAL_COMMUNITY): Payer: Self-pay

## 2020-12-12 ENCOUNTER — Encounter (HOSPITAL_COMMUNITY)
Admission: RE | Admit: 2020-12-12 | Discharge: 2020-12-12 | Disposition: A | Discharge status: Home / Self Care | Payer: PPO | Source: Ambulatory Visit | Attending: General Surgery | Admitting: General Surgery

## 2020-12-12 NOTE — Pre-Procedure Instructions (Signed)
Interoffice message sent to [facility] that Lori [name] was a no show for her PAT today.

## 2020-12-15 ENCOUNTER — Encounter (HOSPITAL_COMMUNITY)
Admission: RE | Disposition: A | Discharge status: Home / Self Care | Payer: Self-pay | Source: Ambulatory Visit | Attending: General Surgery

## 2020-12-15 ENCOUNTER — Ambulatory Visit (HOSPITAL_COMMUNITY): Payer: PPO | Admitting: Certified Registered"

## 2020-12-15 ENCOUNTER — Encounter (HOSPITAL_COMMUNITY): Payer: Self-pay | Admitting: General Surgery

## 2020-12-15 ENCOUNTER — Ambulatory Visit (HOSPITAL_COMMUNITY)
Admission: RE | Admit: 2020-12-15 | Discharge: 2020-12-15 | Disposition: A | Discharge status: Home / Self Care | Payer: PPO | Source: Ambulatory Visit | Attending: General Surgery | Admitting: General Surgery

## 2020-12-15 DIAGNOSIS — I10 Essential (primary) hypertension: Secondary | ICD-10-CM | POA: Diagnosis not present

## 2020-12-15 DIAGNOSIS — K432 Incisional hernia without obstruction or gangrene: Secondary | ICD-10-CM | POA: Diagnosis not present

## 2020-12-15 DIAGNOSIS — F419 Anxiety disorder, unspecified: Secondary | ICD-10-CM | POA: Insufficient documentation

## 2020-12-15 DIAGNOSIS — Z79899 Other long term (current) drug therapy: Secondary | ICD-10-CM | POA: Insufficient documentation

## 2020-12-15 HISTORY — PX: INCISIONAL HERNIA REPAIR: SHX193

## 2020-12-15 LAB — CBC WITH DIFFERENTIAL/PLATELET
Abs Immature Granulocytes: 0.03 10*3/uL (ref 0.00–0.07)
Basophils Absolute: 0.2 10*3/uL — ABNORMAL HIGH (ref 0.0–0.1)
Basophils Relative: 2 %
Eosinophils Absolute: 0.3 10*3/uL (ref 0.0–0.5)
Eosinophils Relative: 4 %
HCT: 36.6 % (ref 36.0–46.0)
Hemoglobin: 12 g/dL (ref 12.0–15.0)
Immature Granulocytes: 0 %
Lymphocytes Relative: 30 %
Lymphs Abs: 2.5 10*3/uL (ref 0.7–4.0)
MCH: 29.3 pg (ref 26.0–34.0)
MCHC: 32.8 g/dL (ref 30.0–36.0)
MCV: 89.3 fL (ref 80.0–100.0)
Monocytes Absolute: 0.6 10*3/uL (ref 0.1–1.0)
Monocytes Relative: 7 %
Neutro Abs: 4.7 10*3/uL (ref 1.7–7.7)
Neutrophils Relative %: 57 %
Platelets: 384 10*3/uL (ref 150–400)
RBC: 4.1 MIL/uL (ref 3.87–5.11)
RDW: 13.6 % (ref 11.5–15.5)
WBC: 8.4 10*3/uL (ref 4.0–10.5)
nRBC: 0 % (ref 0.0–0.2)

## 2020-12-15 LAB — BASIC METABOLIC PANEL
Anion gap: 6 (ref 5–15)
BUN: 7 mg/dL — ABNORMAL LOW (ref 8–23)
CO2: 28 mmol/L (ref 22–32)
Calcium: 8.6 mg/dL — ABNORMAL LOW (ref 8.9–10.3)
Chloride: 100 mmol/L (ref 98–111)
Creatinine, Ser: 0.61 mg/dL (ref 0.44–1.00)
GFR, Estimated: >60 mL/min (ref >60)
Glucose, Bld: 94 mg/dL (ref 70–99)
Potassium: 3.6 mmol/L (ref 3.5–5.1)
Sodium: 134 mmol/L — ABNORMAL LOW (ref 135–145)

## 2020-12-15 SURGERY — REPAIR, HERNIA, INCISIONAL
Anesthesia: General | Site: Abdomen

## 2020-12-15 MED ORDER — 0.9 % SODIUM CHLORIDE (POUR BTL) OPTIME
TOPICAL | Status: DC | PRN
  Administered 2020-12-15: 1000 mL

## 2020-12-15 MED ORDER — ONDANSETRON HCL 4 MG/2ML IJ SOLN
INTRAMUSCULAR | Status: AC
  Filled 2020-12-15: qty 2

## 2020-12-15 MED ORDER — FENTANYL CITRATE (PF) 100 MCG/2ML IJ SOLN
INTRAMUSCULAR | Status: DC | PRN
  Administered 2020-12-15 (×4): 25 ug via INTRAVENOUS

## 2020-12-15 MED ORDER — OXYCODONE HCL 5 MG PO TABS: 5.0000 mg | ORAL_TABLET | ORAL | 0 refills | Status: DC | PRN

## 2020-12-15 MED ORDER — FENTANYL CITRATE (PF) 100 MCG/2ML IJ SOLN: 25.0000 ug | INTRAMUSCULAR | Status: DC | PRN

## 2020-12-15 MED ORDER — GLYCOPYRROLATE PF 0.2 MG/ML IJ SOSY
PREFILLED_SYRINGE | INTRAMUSCULAR | Status: AC
  Filled 2020-12-15: qty 1

## 2020-12-15 MED ORDER — MIDAZOLAM HCL 5 MG/5ML IJ SOLN
INTRAMUSCULAR | Status: DC | PRN
  Administered 2020-12-15: 2 mg via INTRAVENOUS

## 2020-12-15 MED ORDER — DEXAMETHASONE SODIUM PHOSPHATE 4 MG/ML IJ SOLN
INTRAMUSCULAR | Status: DC | PRN
  Administered 2020-12-15: 4 mg via INTRAVENOUS

## 2020-12-15 MED ORDER — LIDOCAINE HCL (PF) 2 % IJ SOLN
INTRAMUSCULAR | Status: AC
  Filled 2020-12-15: qty 5

## 2020-12-15 MED ORDER — CEFAZOLIN SODIUM-DEXTROSE 2-4 GM/100ML-% IV SOLN
INTRAVENOUS | Status: AC
  Filled 2020-12-15: qty 100

## 2020-12-15 MED ORDER — PROPOFOL 10 MG/ML IV BOLUS
INTRAVENOUS | Status: DC | PRN
  Administered 2020-12-15: 200 mg via INTRAVENOUS

## 2020-12-15 MED ORDER — CHLORHEXIDINE GLUCONATE CLOTH 2 % EX PADS: 6.0000 | MEDICATED_PAD | Freq: Once | CUTANEOUS | Status: DC

## 2020-12-15 MED ORDER — SODIUM CHLORIDE FLUSH 0.9 % IV SOLN
INTRAVENOUS | Status: AC
  Filled 2020-12-15: qty 10

## 2020-12-15 MED ORDER — LIDOCAINE HCL (CARDIAC) PF 100 MG/5ML IV SOSY
PREFILLED_SYRINGE | INTRAVENOUS | Status: DC | PRN
  Administered 2020-12-15: 80 mg via INTRAVENOUS

## 2020-12-15 MED ORDER — CHLORHEXIDINE GLUCONATE 0.12 % MT SOLN
15.0000 mL | Freq: Once | OROMUCOSAL | Status: AC
  Administered 2020-12-15: 15 mL via OROMUCOSAL

## 2020-12-15 MED ORDER — KETOROLAC TROMETHAMINE 15 MG/ML IJ SOLN
15.0000 mg | Freq: Once | INTRAMUSCULAR | Status: AC
  Administered 2020-12-15: 15 mg via INTRAVENOUS

## 2020-12-15 MED ORDER — FENTANYL CITRATE (PF) 100 MCG/2ML IJ SOLN
INTRAMUSCULAR | Status: AC
  Filled 2020-12-15: qty 2

## 2020-12-15 MED ORDER — DEXAMETHASONE SODIUM PHOSPHATE 10 MG/ML IJ SOLN
INTRAMUSCULAR | Status: AC
  Filled 2020-12-15: qty 1

## 2020-12-15 MED ORDER — LACTATED RINGERS IV SOLN
INTRAVENOUS | Status: DC
  Administered 2020-12-15: 1000 mL via INTRAVENOUS

## 2020-12-15 MED ORDER — CEFAZOLIN SODIUM-DEXTROSE 2-4 GM/100ML-% IV SOLN
2.0000 g | INTRAVENOUS | Status: AC
  Administered 2020-12-15: 2 g via INTRAVENOUS

## 2020-12-15 MED ORDER — ONDANSETRON HCL 4 MG/2ML IJ SOLN: 4.0000 mg | Freq: Once | INTRAMUSCULAR | Status: DC | PRN

## 2020-12-15 MED ORDER — MIDAZOLAM HCL 2 MG/2ML IJ SOLN
INTRAMUSCULAR | Status: AC
  Filled 2020-12-15: qty 2

## 2020-12-15 MED ORDER — BUPIVACAINE LIPOSOME 1.3 % IJ SUSP
INTRAMUSCULAR | Status: DC | PRN
  Administered 2020-12-15: 20 mL

## 2020-12-15 MED ORDER — PROPOFOL 10 MG/ML IV BOLUS
INTRAVENOUS | Status: AC
  Filled 2020-12-15: qty 20

## 2020-12-15 MED ORDER — ONDANSETRON HCL 4 MG/2ML IJ SOLN
INTRAMUSCULAR | Status: DC | PRN
  Administered 2020-12-15: 4 mg via INTRAVENOUS

## 2020-12-15 MED ORDER — ONDANSETRON HCL 4 MG PO TABS: 4.0000 mg | ORAL_TABLET | Freq: Three times a day (TID) | ORAL | 1 refills | Status: DC | PRN

## 2020-12-15 MED ORDER — GLYCOPYRROLATE 0.2 MG/ML IJ SOLN
INTRAMUSCULAR | Status: DC | PRN
  Administered 2020-12-15: .1 mg via INTRAVENOUS

## 2020-12-15 MED ORDER — KETOROLAC TROMETHAMINE 15 MG/ML IJ SOLN
INTRAMUSCULAR | Status: AC
  Filled 2020-12-15: qty 1

## 2020-12-15 MED ORDER — ORAL CARE MOUTH RINSE: 15.0000 mL | Freq: Once | OROMUCOSAL | Status: AC

## 2020-12-15 MED ORDER — BUPIVACAINE LIPOSOME 1.3 % IJ SUSP
INTRAMUSCULAR | Status: AC
  Filled 2020-12-15: qty 20

## 2020-12-15 SURGICAL SUPPLY — 37 items
ADH SKN CLS APL DERMABOND .7 (GAUZE/BANDAGES/DRESSINGS) ×1
APL PRP STRL LF DISP 70% ISPRP (MISCELLANEOUS) ×1
CHLORAPREP W/TINT 26 (MISCELLANEOUS) ×2 IMPLANT
CLOTH BEACON ORANGE TIMEOUT ST (SAFETY) ×2 IMPLANT
COVER LIGHT HANDLE STERIS (MISCELLANEOUS) ×4 IMPLANT
DERMABOND ADVANCED (GAUZE/BANDAGES/DRESSINGS) ×1
DERMABOND ADVANCED .7 DNX12 (GAUZE/BANDAGES/DRESSINGS) ×1 IMPLANT
ELECT REM PT RETURN 9FT ADLT (ELECTROSURGICAL) ×2
ELECTRODE REM PT RTRN 9FT ADLT (ELECTROSURGICAL) ×1 IMPLANT
GAUZE 4X4 16PLY ~~LOC~~+RFID DBL (SPONGE) ×1 IMPLANT
GLOVE SRG 8 PF TXTR STRL LF DI (GLOVE) IMPLANT
GLOVE SURG ENC MOIS LTX SZ6.5 (GLOVE) ×2 IMPLANT
GLOVE SURG SS PI 7.5 STRL IVOR (GLOVE) ×2 IMPLANT
GLOVE SURG UNDER POLY LF SZ6.5 (GLOVE) ×2 IMPLANT
GLOVE SURG UNDER POLY LF SZ7 (GLOVE) ×2 IMPLANT
GLOVE SURG UNDER POLY LF SZ8 (GLOVE) ×2
GOWN STRL REUS W/TWL LRG LVL3 (GOWN DISPOSABLE) ×5 IMPLANT
GOWN STRL REUS W/TWL XL LVL3 (GOWN DISPOSABLE) ×1 IMPLANT
INST SET MINOR GENERAL (KITS) ×2 IMPLANT
KIT TURNOVER KIT A (KITS) ×2 IMPLANT
MANIFOLD NEPTUNE II (INSTRUMENTS) ×2 IMPLANT
MESH VENTRALEX ST 1-7/10 CRC S (Mesh General) ×1 IMPLANT
NDL HYPO 18GX1.5 BLUNT FILL (NEEDLE) IMPLANT
NDL HYPO 21X1.5 SAFETY (NEEDLE) ×1 IMPLANT
NEEDLE HYPO 18GX1.5 BLUNT FILL (NEEDLE) ×2 IMPLANT
NEEDLE HYPO 21X1.5 SAFETY (NEEDLE) ×2 IMPLANT
NS IRRIG 1000ML POUR BTL (IV SOLUTION) ×2 IMPLANT
PACK MINOR (CUSTOM PROCEDURE TRAY) ×2 IMPLANT
PAD ARMBOARD 7.5X6 YLW CONV (MISCELLANEOUS) ×2 IMPLANT
SET BASIN LINEN APH (SET/KITS/TRAYS/PACK) ×2 IMPLANT
SUT ETHIBOND 0 MO6 C/R (SUTURE) ×1 IMPLANT
SUT MNCRL AB 4-0 PS2 18 (SUTURE) ×2 IMPLANT
SUT VIC AB 2-0 CT1 27 (SUTURE) ×2
SUT VIC AB 2-0 CT1 TAPERPNT 27 (SUTURE) ×1 IMPLANT
SUT VIC AB 3-0 SH 27 (SUTURE) ×2
SUT VIC AB 3-0 SH 27X BRD (SUTURE) ×1 IMPLANT
SYR 20ML LL LF (SYRINGE) ×2 IMPLANT

## 2020-12-15 NOTE — Anesthesia Preprocedure Evaluation (Signed)
Anesthesia Evaluation  Patient identified by MRN, date of birth, ID band Patient awake    Reviewed: Allergy & Precautions, H&P , NPO status , Patient's Chart, lab work & pertinent test results, reviewed documented beta blocker date and time   Airway Mallampati: II  TM Distance: >3 FB Neck ROM: full    Dental no notable dental hx.    Pulmonary neg pulmonary ROS,    Pulmonary exam normal breath sounds clear to auscultation       Cardiovascular Exercise Tolerance: Good hypertension, negative cardio ROS   Rhythm:regular Rate:Normal     Neuro/Psych PSYCHIATRIC DISORDERS Anxiety  Neuromuscular disease    GI/Hepatic negative GI ROS, Neg liver ROS,   Endo/Other  negative endocrine ROS  Renal/GU negative Renal ROS  negative genitourinary   Musculoskeletal   Abdominal   Peds  Hematology negative hematology ROS (+)   Anesthesia Other Findings   Reproductive/Obstetrics negative OB ROS                             Anesthesia Physical Anesthesia Plan  ASA: 2  Anesthesia Plan: General   Post-op Pain Management:    Induction:   PONV Risk Score and Plan: Ondansetron  Airway Management Planned:   Additional Equipment:   Intra-op Plan:   Post-operative Plan:   Informed Consent: I have reviewed the patients History and Physical, chart, labs and discussed the procedure including the risks, benefits and alternatives for the proposed anesthesia with the patient or authorized representative who has indicated his/her understanding and acceptance.     Dental Advisory Given  Plan Discussed with: CRNA  Anesthesia Plan Comments:         Anesthesia Quick Evaluation

## 2020-12-15 NOTE — Transfer of Care (Signed)
Immediate Anesthesia Transfer of Care Note  Patient: Lori [name]  Procedure(s) Performed: HERNIA REPAIR INCISIONAL W/MESH (Abdomen)  Patient Location: PACU  Anesthesia Type:General  Level of Consciousness: drowsy and patient cooperative  Airway & Oxygen Therapy: Patient Spontanous Breathing and Patient connected to face mask oxygen  Post-op Assessment: Report given to RN, Post -op Vital signs reviewed and stable and Patient moving all extremities X 4  Post vital signs: Reviewed and stable  Last Vitals:  Vitals Value Taken Time  BP 101/75 12/15/20 1201  Temp    Pulse 63 12/15/20 1205  Resp 10 12/15/20 1205  SpO2 100 % 12/15/20 1205  Vitals shown include unvalidated device data.  Last Pain:  Vitals:   12/15/20 0945  TempSrc: Oral  PainSc: 0-No pain      Patients Stated Pain Goal: 5 (71/24/58 0998)  Complications: No notable events documented.

## 2020-12-15 NOTE — Discharge Instructions (Signed)
Discharge Open Abdominal Surgery Instructions:  Common Complaints: Pain at the incision site is common. This will improve with time.  Take your pain medications as described below.   Diet/ Activity: Diet as tolerated. You have started and tolerated a diet in the hospital, and should continue to increase what you are able to eat.   You may not have a large appetite, but it is important to stay hydrated. Drink 64 ounces of water a day. Your appetite will return with time.  Keep a dry dressing in place over your staples daily or as needed. Some minor pink/ blood tinged drainage is expected. This will stop in a few days after surgery.  Shower per your regular routine daily.  Do not take hot showers. Take warm showers that are less than 10 minutes. Pat the incision dry. Wear an abdominal binder daily with activity. You do not have to wear this while sleeping or sitting.  Rest and listen to your body, but do not remain in bed all day.  Walk everyday for at least 15-20 minutes. Deep cough and move around every 1-2 hours in the first few days after surgery.  Do not lift > 10 lbs, perform excessive bending, pushing, pulling, squatting for 6-8 weeks after surgery.  The activity restrictions and the abdominal binder are to prevent hernia formation at your incision while you are healing.  Do not place lotions or balms on your incision unless instructed to specifically by Dr. Constance Haw.   Pain Expectations and Narcotics: -After surgery you will have pain associated with your incisions and this is normal. The pain is muscular and nerve pain, and will get better with time. -You are encouraged and expected to take non narcotic medications like tylenol and ibuprofen (when able) to treat pain as multiple modalities can aid with pain treatment. -Narcotics are only used when pain is severe or there is breakthrough pain. -You are not expected to have a pain score of 0 after surgery, as we cannot prevent pain. A pain  score of 3-4 that allows you to be functional, move, walk, and tolerate some activity is the goal. The pain will continue to improve over the days after surgery and is dependent on your surgery. -Due to [place] law, we are only able to give a certain amount of pain medication to treat post operative pain, and we only give additional narcotics on a patient by patient basis.  -For most laparoscopic surgery, studies have shown that the majority of patients only need 10-15 narcotic pills, and for open surgeries most patients only need 15-20.   -Having appropriate expectations of pain and knowledge of pain management with non narcotics is important as we do not want anyone to become addicted to narcotic pain medication.  -Using ice packs in the first 48 hours and heating pads after 48 hours, wearing an abdominal binder (when recommended), and using over the counter medications are all ways to help with pain management.   -Simple acts like meditation and mindfulness practices after surgery can also help with pain control and research has proven the benefit of these practices.  Medication: Take tylenol and ibuprofen as needed for pain control, alternating every 4-6 hours.  Example:  Tylenol 1000mg  @ 6am, 12noon, 6pm, 66midnight (Do not exceed 4000mg  of tylenol a day). Ibuprofen 800mg  @ 9am, 3pm, 9pm, 3am (Do not exceed 3600mg  of ibuprofen a day).  Take Roxicodone for breakthrough pain every 4 hours.  Take Colace for constipation related to narcotic pain medication.  If you do not have a bowel movement in 2 days, take Miralax over the counter.  Drink plenty of water to also prevent constipation.   Contact Information: If you have questions or concerns, please call our office, [phone], Monday- Thursday 8AM-5PM and Friday 8AM-12Noon.  If it is after hours or on the weekend, please call [facility], [phone], [phone], and ask to speak to the surgeon on call for Dr. Constance Haw at [facility].

## 2020-12-15 NOTE — Progress Notes (Signed)
[facility]  Rx sent to pharmacy. Talked to husband and updated him. Binder ordered. Will check on in office 8/16.  Curlene Labrum, MD [facility] [address] [place], [place] [phone] (office)

## 2020-12-15 NOTE — Op Note (Signed)
[facility] Operative Note  12/15/20  Preoperative Diagnosis: Incisional hernia    Postoperative Diagnosis: Same   Procedure(s) Performed: Incisional hernia repair with mesh, 4.3cm Ventralex   Surgeon: Lanell Matar. Constance Haw, MD   Assistants: No qualified resident was available    Anesthesia: General endotracheal   Anesthesiologist: Louann Sjogren, MD    Specimens: None    Estimated Blood Loss: Minimal   Blood Replacement: None    Complications: None   Wound Class: Clean    Operative Indications: Lori [name] is a 70 yo with a superior incisional hernia at the top of her midline incision. This was noted on CT in 2018. We discussed incisional hernia repair with mesh and risk of bleeding, infection, recurrence, use of mesh, injury to bowel.   Findings: 1-1.5cm fascia defect, fat in the hernia    Procedure: The patient was taken to the operating room and placed supine. General endotracheal anesthesia was induced. Intravenous antibiotics were  administered per protocol.  The abdomen was prepared and draped in the usual sterile fashion.   The incisional hernia was noted to be reducible and measured about 1-1.5cm defect at the superior portion of her prior incision. An incision was made at the superior portion of the incision, and carried down through the subcutaneous tissue with electrocautery.  Dissection was performed down to the level of the fascia, exposing the hernia sac.  The hernia sac was opened with care, and excess hernia sac was resected with electrocautery.  A finger was ran on the underlying peritoneum and this was clear.  A 4.3 cm Ventralex St Hernia Patch was placed and secured with 0 Ethibond sutures ensuring that it was against the peritoneal cavity.  The hernia defect was then closed with 0 Ethibond suture in an interrupted fashion over the patch.   Hemostasis was confirmed. The deep space was closed with 3-0 interrupted Vicryl. The skin was closed with  a running 4-0 Monocryl suture and dermabond.    All counts were correct at the end of the case. The patient was awakened from anesthesia and extubated without complication.  The patient went to the PACU in stable condition.  Curlene Labrum, MD [facility] [address] [place], [place] [address] [phone] (office)

## 2020-12-15 NOTE — Anesthesia Postprocedure Evaluation (Signed)
Anesthesia Post Note  Patient: Lori [name]  Procedure(s) Performed: HERNIA REPAIR INCISIONAL W/MESH (Abdomen)  Patient location during evaluation: Phase II Anesthesia Type: General Level of consciousness: awake Pain management: pain level controlled Vital Signs Assessment: post-procedure vital signs reviewed and stable Respiratory status: spontaneous breathing and respiratory function stable Cardiovascular status: blood pressure returned to baseline and stable Postop Assessment: no headache and no apparent nausea or vomiting Anesthetic complications: no Comments: Late entry   No notable events documented.   Last Vitals:  Vitals:   12/15/20 1230 12/15/20 1245  BP: 133/80 130/78  Pulse: 76 (!) 57  Resp: 12 11  Temp:    SpO2: 97% 91%    Last Pain:  Vitals:   12/15/20 1245  TempSrc:   PainSc: [place]

## 2020-12-15 NOTE — Anesthesia Procedure Notes (Signed)
Procedure Name: LMA Insertion Date/Time: 12/15/2020 11:11 AM Performed by: Tacy Learn, CRNA Pre-anesthesia Checklist: Patient identified, Emergency Drugs available, Suction available, Patient being monitored and Timeout performed Patient Re-evaluated:Patient Re-evaluated prior to induction Oxygen Delivery Method: Circle system utilized Preoxygenation: Pre-oxygenation with 100% oxygen Induction Type: IV induction LMA: LMA inserted LMA Size: 3.0 Number of attempts: 1 Placement Confirmation: positive ETCO2, CO2 detector and breath sounds checked- equal and bilateral Tube secured with: Tape Dental Injury: Teeth and Oropharynx as per pre-operative assessment  Comments: Med student inserted, [place], under supervision.
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
## 2020-12-16 ENCOUNTER — Encounter (HOSPITAL_COMMUNITY): Payer: Self-pay | Admitting: General Surgery

## 2020-12-17 ENCOUNTER — Other Ambulatory Visit: Payer: Self-pay | Admitting: General Surgery

## 2020-12-23 ENCOUNTER — Telehealth: Payer: Self-pay | Admitting: Family Medicine

## 2020-12-23 NOTE — Telephone Encounter (Signed)
Pt called and states that she has developed a rash on her torso area from under her breast around her back. She states that they kind of look like "Chiggers" but that is not what it is as she has not been outside. She has been using neosporin on it which helps.   Instructed pt to use Benadryl and OTC hydrocortisone. She is to call back in a few days if this does not help. Pt stopped her oxycodone on 12/19/2020 when the rash first appeared and the rash has improved since then.   Pt verbalized understanding.

## 2021-01-20 ENCOUNTER — Other Ambulatory Visit: Payer: Self-pay

## 2021-01-20 ENCOUNTER — Ambulatory Visit (INDEPENDENT_AMBULATORY_CARE_PROVIDER_SITE_OTHER): Payer: PPO | Admitting: General Surgery

## 2021-01-20 ENCOUNTER — Encounter: Payer: Self-pay | Admitting: General Surgery

## 2021-01-20 VITALS — BP 191/99 | HR 58 | Temp 98.2°F | Resp 14 | Ht 62.0 in | Wt 111.0 lb

## 2021-01-20 DIAGNOSIS — K432 Incisional hernia without obstruction or gangrene: Secondary | ICD-10-CM

## 2021-01-20 NOTE — Progress Notes (Signed)
[facility]  Patient doing well and having no complaints after hernia repair. Feeling good. Healing well.   BP (!) 191/99   Pulse (!) 58   Temp 98.2 F (36.8 C) (Other (Comment))   Resp 14   Ht '5\' 2"'$  (1.575 m)   Wt 111 lb (50.3 kg)   SpO2 97%   BMI 20.30 kg/m  Incision healed, no signs of hernia  Ok to start lifting > 10 lbs, bending pushing and pulling at 6 weeks after surgery. Ok to start swimming.   PRN Follow up  Curlene Labrum, MD [facility] [address] [place], [place] [address] [phone] (office)

## 2021-01-20 NOTE — Patient Instructions (Signed)
Ok to start lifting > 10 lbs, bending pushing and pulling at 6 weeks after surgery. Ok to start swimming.

## 2021-01-23 ENCOUNTER — Telehealth: Payer: Self-pay

## 2021-01-23 MED ORDER — BACLOFEN 10 MG PO TABS: 5.0000 mg | ORAL_TABLET | Freq: Three times a day (TID) | ORAL | 1 refills | Status: DC | PRN

## 2021-01-23 NOTE — Telephone Encounter (Signed)
I tried calling the patient - got a message that the call could not go through at this time. The patient can check with the pharmacy.

## 2021-01-23 NOTE — Telephone Encounter (Signed)
Please advise 

## 2021-01-23 NOTE — Telephone Encounter (Signed)
Pt called stating that her neck is really stiff and she is hot around her neck and it is causing headaches. She has an appt next week with her chiropractor. She stated she hasn't been taking the muscle relaxer's that she was proscribed from Dr . Junius Roads but she said she thinks she needs them now and would like some sent in to [facility]

## 2021-01-27 ENCOUNTER — Ambulatory Visit: Payer: PPO | Admitting: Family Medicine

## 2021-01-29 ENCOUNTER — Ambulatory Visit: Payer: PPO | Admitting: Family Medicine

## 2021-01-30 ENCOUNTER — Other Ambulatory Visit: Payer: Self-pay

## 2021-01-30 ENCOUNTER — Ambulatory Visit (INDEPENDENT_AMBULATORY_CARE_PROVIDER_SITE_OTHER): Payer: PPO | Admitting: Family Medicine

## 2021-01-30 ENCOUNTER — Encounter: Payer: Self-pay | Admitting: Family Medicine

## 2021-01-30 DIAGNOSIS — M4722 Other spondylosis with radiculopathy, cervical region: Secondary | ICD-10-CM | POA: Diagnosis not present

## 2021-01-30 MED ORDER — COLCHICINE 0.6 MG PO CAPS: 1.0000 | ORAL_CAPSULE | Freq: Two times a day (BID) | ORAL | 3 refills | Status: DC | PRN

## 2021-01-30 MED ORDER — CELECOXIB 100 MG PO CAPS: 100.0000 mg | ORAL_CAPSULE | Freq: Two times a day (BID) | ORAL | 3 refills | Status: DC | PRN
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
## 2021-01-30 NOTE — Patient Instructions (Signed)
    Eunice Blase, MD  [email or web address]

## 2021-02-25 ENCOUNTER — Ambulatory Visit: Payer: PPO | Admitting: Physical Medicine and Rehabilitation

## 2022-03-26 ENCOUNTER — Ambulatory Visit
Admission: EM | Admit: 2022-03-26 | Discharge: 2022-03-26 | Disposition: A | Discharge status: Home / Self Care | Payer: PPO

## 2022-03-26 DIAGNOSIS — L0291 Cutaneous abscess, unspecified: Secondary | ICD-10-CM

## 2022-03-26 MED ORDER — DOXYCYCLINE HYCLATE 100 MG PO CAPS: 100.0000 mg | ORAL_CAPSULE | Freq: Two times a day (BID) | ORAL | 0 refills | Status: DC

## 2022-03-26 NOTE — ED Triage Notes (Signed)
Patient presents to UC for abscess located on the right groin x 2 weeks ago. States redness and now another one popped up. Concerned with MRSA her spouse had MRSA 10 years ago. Treating with neosporin.   Denies fever or drainage.
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
## 2022-03-30 ENCOUNTER — Emergency Department (HOSPITAL_COMMUNITY)
Admission: EM | Admit: 2022-03-30 | Discharge: 2022-03-30 | Disposition: A | Discharge status: Home / Self Care | Payer: PPO | Attending: Emergency Medicine | Admitting: Emergency Medicine

## 2022-03-30 ENCOUNTER — Emergency Department (HOSPITAL_COMMUNITY): Payer: PPO

## 2022-03-30 ENCOUNTER — Other Ambulatory Visit: Payer: Self-pay

## 2022-03-30 ENCOUNTER — Encounter (HOSPITAL_COMMUNITY): Payer: Self-pay

## 2022-03-30 DIAGNOSIS — R519 Headache, unspecified: Secondary | ICD-10-CM | POA: Insufficient documentation

## 2022-03-30 DIAGNOSIS — Y92410 Unspecified street and highway as the place of occurrence of the external cause: Secondary | ICD-10-CM | POA: Diagnosis not present

## 2022-03-30 DIAGNOSIS — E871 Hypo-osmolality and hyponatremia: Secondary | ICD-10-CM | POA: Diagnosis not present

## 2022-03-30 DIAGNOSIS — Z79899 Other long term (current) drug therapy: Secondary | ICD-10-CM | POA: Insufficient documentation

## 2022-03-30 DIAGNOSIS — S20212A Contusion of left front wall of thorax, initial encounter: Secondary | ICD-10-CM | POA: Diagnosis not present

## 2022-03-30 DIAGNOSIS — R42 Dizziness and giddiness: Secondary | ICD-10-CM | POA: Diagnosis not present

## 2022-03-30 DIAGNOSIS — R209 Unspecified disturbances of skin sensation: Secondary | ICD-10-CM | POA: Insufficient documentation

## 2022-03-30 DIAGNOSIS — E876 Hypokalemia: Secondary | ICD-10-CM | POA: Insufficient documentation

## 2022-03-30 DIAGNOSIS — M542 Cervicalgia: Secondary | ICD-10-CM | POA: Diagnosis not present

## 2022-03-30 DIAGNOSIS — I10 Essential (primary) hypertension: Secondary | ICD-10-CM | POA: Diagnosis not present

## 2022-03-30 DIAGNOSIS — S20302A Unspecified superficial injuries of left front wall of thorax, initial encounter: Secondary | ICD-10-CM | POA: Diagnosis present

## 2022-03-30 LAB — CBC WITH DIFFERENTIAL/PLATELET
Abs Immature Granulocytes: 0.02 10*3/uL (ref 0.00–0.07)
Basophils Absolute: 0.1 10*3/uL (ref 0.0–0.1)
Basophils Relative: 2 %
Eosinophils Absolute: 0.2 10*3/uL (ref 0.0–0.5)
Eosinophils Relative: 3 %
HCT: 35.5 % — ABNORMAL LOW (ref 36.0–46.0)
Hemoglobin: 12.4 g/dL (ref 12.0–15.0)
Immature Granulocytes: 0 %
Lymphocytes Relative: 29 %
Lymphs Abs: 2.4 10*3/uL (ref 0.7–4.0)
MCH: 29.5 pg (ref 26.0–34.0)
MCHC: 34.9 g/dL (ref 30.0–36.0)
MCV: 84.5 fL (ref 80.0–100.0)
Monocytes Absolute: 0.7 10*3/uL (ref 0.1–1.0)
Monocytes Relative: 8 %
Neutro Abs: 4.7 10*3/uL (ref 1.7–7.7)
Neutrophils Relative %: 58 %
Platelets: 333 10*3/uL (ref 150–400)
RBC: 4.2 MIL/uL (ref 3.87–5.11)
RDW: 12.7 % (ref 11.5–15.5)
WBC: 8.1 10*3/uL (ref 4.0–10.5)
nRBC: 0 % (ref 0.0–0.2)

## 2022-03-30 LAB — TROPONIN I (HIGH SENSITIVITY): Troponin I (High Sensitivity): 11 ng/L (ref <18)

## 2022-03-30 LAB — COMPREHENSIVE METABOLIC PANEL
ALT: 13 U/L (ref 0–44)
AST: 25 U/L (ref 15–41)
Albumin: 3.5 g/dL (ref 3.5–5.0)
Alkaline Phosphatase: 84 U/L (ref 38–126)
Anion gap: 11 (ref 5–15)
BUN: 8 mg/dL (ref 8–23)
CO2: 26 mmol/L (ref 22–32)
Calcium: 9 mg/dL (ref 8.9–10.3)
Chloride: 90 mmol/L — ABNORMAL LOW (ref 98–111)
Creatinine, Ser: 0.71 mg/dL (ref 0.44–1.00)
GFR, Estimated: >60 mL/min (ref >60)
Glucose, Bld: 125 mg/dL — ABNORMAL HIGH (ref 70–99)
Potassium: 3.1 mmol/L — ABNORMAL LOW (ref 3.5–5.1)
Sodium: 127 mmol/L — ABNORMAL LOW (ref 135–145)
Total Bilirubin: 0.3 mg/dL (ref 0.3–1.2)
Total Protein: 7 g/dL (ref 6.5–8.1)

## 2022-03-30 MED ORDER — LACTATED RINGERS IV BOLUS: 500.0000 mL | Freq: Once | INTRAVENOUS | Status: DC

## 2022-03-30 MED ORDER — ACETAMINOPHEN 500 MG PO TABS: 1000.0000 mg | ORAL_TABLET | Freq: Once | ORAL | Status: DC

## 2022-03-30 MED ORDER — POTASSIUM CHLORIDE CRYS ER 20 MEQ PO TBCR: 20.0000 meq | EXTENDED_RELEASE_TABLET | Freq: Two times a day (BID) | ORAL | 0 refills | Status: DC

## 2022-03-30 MED ORDER — POTASSIUM CHLORIDE CRYS ER 20 MEQ PO TBCR: 20.0000 meq | EXTENDED_RELEASE_TABLET | Freq: Once | ORAL | Status: DC

## 2022-03-30 MED ORDER — CYCLOBENZAPRINE HCL 10 MG PO TABS: 5.0000 mg | ORAL_TABLET | Freq: Once | ORAL | Status: DC

## 2022-03-30 MED ORDER — CYCLOBENZAPRINE HCL 10 MG PO TABS: 10.0000 mg | ORAL_TABLET | Freq: Two times a day (BID) | ORAL | 0 refills | Status: DC | PRN

## 2022-03-30 NOTE — ED Notes (Signed)
Reviewed discharge instructions with patient and significant other. Follow-up care and medications and pain management reviewed. Patient verbalized understanding. Patient A&Ox4, VSS, and ambulatory with steady gait upon discharge.
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
## 2022-03-30 NOTE — ED Notes (Signed)
Pt transported to xray 

## 2022-05-27 ENCOUNTER — Encounter: Payer: Self-pay | Admitting: Emergency Medicine

## 2022-05-27 ENCOUNTER — Ambulatory Visit
Admission: EM | Admit: 2022-05-27 | Discharge: 2022-05-27 | Disposition: A | Discharge status: Home / Self Care | Payer: PPO | Attending: Family Medicine | Admitting: Family Medicine

## 2022-05-27 ENCOUNTER — Other Ambulatory Visit: Payer: Self-pay

## 2022-05-27 DIAGNOSIS — M109 Gout, unspecified: Secondary | ICD-10-CM

## 2022-05-27 DIAGNOSIS — I10 Essential (primary) hypertension: Secondary | ICD-10-CM

## 2022-05-27 MED ORDER — COLCHICINE 0.6 MG PO TABS: ORAL_TABLET | ORAL | 0 refills | Status: DC

## 2022-05-27 MED ORDER — INDOMETHACIN 25 MG PO CAPS: 25.0000 mg | ORAL_CAPSULE | Freq: Three times a day (TID) | ORAL | 0 refills | Status: DC

## 2022-05-27 MED ORDER — HYDROCODONE-ACETAMINOPHEN 5-325 MG PO TABS: 1.0000 | ORAL_TABLET | Freq: Four times a day (QID) | ORAL | 0 refills | Status: DC | PRN
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
## 2022-05-27 NOTE — ED Triage Notes (Signed)
Pt here for left wrist and hand pain from gout; pt sts hx of same and sts started 2 days ago

## 2022-05-27 NOTE — Discharge Instructions (Addendum)
Be aware, you have been prescribed pain medications that may cause drowsiness. While taking this medication, do not take any other medications containing acetaminophen (Tylenol). Do not combine with alcohol or recreational drugs. Please do not drive, operate heavy machinery, or take part in activities that require making important decisions while on this medication as your judgement may be clouded.  Your blood pressure was noted to be elevated during your visit today. If you are currently taking medication for high blood pressure, please ensure you are taking this as directed. If you do not have a history of high blood pressure and your blood pressure remains persistently elevated, you may need to begin taking a medication at some point. You may return here within the next few days to recheck if unable to see your primary care provider or if you do not have a one.  BP (!) 199/81 (BP Location: Left Arm)   Pulse 72   Temp 98.8 F (37.1 C) (Oral)   Resp 18   SpO2 95%   BP Readings from Last 3 Encounters:  05/27/22 (!) 199/81  03/30/22 (!) 180/102  03/26/22 (!) 147/84

## 2022-05-28 ENCOUNTER — Ambulatory Visit: Payer: Self-pay

## 2022-09-24 ENCOUNTER — Encounter: Payer: Self-pay | Admitting: Gastroenterology

## 2022-10-12 ENCOUNTER — Ambulatory Visit: Payer: Self-pay

## 2022-10-13 ENCOUNTER — Ambulatory Visit: Payer: Self-pay

## 2022-11-08 ENCOUNTER — Encounter (HOSPITAL_COMMUNITY): Payer: Self-pay

## 2022-11-08 ENCOUNTER — Emergency Department (HOSPITAL_COMMUNITY): Payer: PPO

## 2022-11-08 ENCOUNTER — Ambulatory Visit: Payer: Self-pay

## 2022-11-08 ENCOUNTER — Emergency Department (HOSPITAL_COMMUNITY)
Admission: EM | Admit: 2022-11-08 | Discharge: 2022-11-08 | Disposition: A | Discharge status: Home / Self Care | Payer: PPO | Attending: Student | Admitting: Student

## 2022-11-08 ENCOUNTER — Ambulatory Visit: Payer: Self-pay | Admitting: *Deleted

## 2022-11-08 ENCOUNTER — Other Ambulatory Visit: Payer: Self-pay

## 2022-11-08 DIAGNOSIS — K5900 Constipation, unspecified: Secondary | ICD-10-CM

## 2022-11-08 DIAGNOSIS — I70209 Unspecified atherosclerosis of native arteries of extremities, unspecified extremity: Secondary | ICD-10-CM

## 2022-11-08 DIAGNOSIS — R1033 Periumbilical pain: Secondary | ICD-10-CM | POA: Insufficient documentation

## 2022-11-08 DIAGNOSIS — R1031 Right lower quadrant pain: Secondary | ICD-10-CM | POA: Insufficient documentation

## 2022-11-08 DIAGNOSIS — Z79899 Other long term (current) drug therapy: Secondary | ICD-10-CM | POA: Insufficient documentation

## 2022-11-08 DIAGNOSIS — I10 Essential (primary) hypertension: Secondary | ICD-10-CM | POA: Diagnosis not present

## 2022-11-08 DIAGNOSIS — K439 Ventral hernia without obstruction or gangrene: Secondary | ICD-10-CM

## 2022-11-08 LAB — COMPREHENSIVE METABOLIC PANEL
ALT: 11 U/L (ref 0–44)
AST: 14 U/L — ABNORMAL LOW (ref 15–41)
Albumin: 3.4 g/dL — ABNORMAL LOW (ref 3.5–5.0)
Alkaline Phosphatase: 67 U/L (ref 38–126)
Anion gap: 9 (ref 5–15)
BUN: 9 mg/dL (ref 8–23)
CO2: 28 mmol/L (ref 22–32)
Calcium: 8.5 mg/dL — ABNORMAL LOW (ref 8.9–10.3)
Chloride: 92 mmol/L — ABNORMAL LOW (ref 98–111)
Creatinine, Ser: 0.74 mg/dL (ref 0.44–1.00)
GFR, Estimated: >60 mL/min (ref >60)
Glucose, Bld: 129 mg/dL — ABNORMAL HIGH (ref 70–99)
Potassium: 3.8 mmol/L (ref 3.5–5.1)
Sodium: 129 mmol/L — ABNORMAL LOW (ref 135–145)
Total Bilirubin: 0.4 mg/dL (ref 0.3–1.2)
Total Protein: 7.1 g/dL (ref 6.5–8.1)

## 2022-11-08 LAB — URINALYSIS, ROUTINE W REFLEX MICROSCOPIC
Bacteria, UA: NONE SEEN
Bilirubin Urine: NEGATIVE
Glucose, UA: NEGATIVE mg/dL
Ketones, ur: NEGATIVE mg/dL
Leukocytes,Ua: NEGATIVE
Nitrite: NEGATIVE
Protein, ur: NEGATIVE mg/dL
Specific Gravity, Urine: 1.002 — ABNORMAL LOW (ref 1.005–1.030)
pH: 6 (ref 5.0–8.0)

## 2022-11-08 LAB — CBC WITH DIFFERENTIAL/PLATELET
Abs Immature Granulocytes: 0.04 10*3/uL (ref 0.00–0.07)
Basophils Absolute: 0.1 10*3/uL (ref 0.0–0.1)
Basophils Relative: 1 %
Eosinophils Absolute: 0.1 10*3/uL (ref 0.0–0.5)
Eosinophils Relative: 1 %
HCT: 33.1 % — ABNORMAL LOW (ref 36.0–46.0)
Hemoglobin: 11.1 g/dL — ABNORMAL LOW (ref 12.0–15.0)
Immature Granulocytes: 0 %
Lymphocytes Relative: 21 %
Lymphs Abs: 2.6 10*3/uL (ref 0.7–4.0)
MCH: 29.1 pg (ref 26.0–34.0)
MCHC: 33.5 g/dL (ref 30.0–36.0)
MCV: 86.9 fL (ref 80.0–100.0)
Monocytes Absolute: 1.3 10*3/uL — ABNORMAL HIGH (ref 0.1–1.0)
Monocytes Relative: 11 %
Neutro Abs: 8 10*3/uL — ABNORMAL HIGH (ref 1.7–7.7)
Neutrophils Relative %: 66 %
Platelets: 258 10*3/uL (ref 150–400)
RBC: 3.81 MIL/uL — ABNORMAL LOW (ref 3.87–5.11)
RDW: 12.7 % (ref 11.5–15.5)
WBC: 12 10*3/uL — ABNORMAL HIGH (ref 4.0–10.5)
nRBC: 0 % (ref 0.0–0.2)

## 2022-11-08 LAB — LIPASE, BLOOD: Lipase: 27 U/L (ref 11–51)

## 2022-11-08 MED ORDER — LACTATED RINGERS IV BOLUS
1000.0000 mL | Freq: Once | INTRAVENOUS | Status: AC
  Administered 2022-11-08: 1000 mL via INTRAVENOUS

## 2022-11-08 MED ORDER — POLYETHYLENE GLYCOL 3350 17 GM/SCOOP PO POWD: 1.0000 | Freq: Once | ORAL | 0 refills | Status: AC

## 2022-11-08 MED ORDER — BISACODYL 10 MG RE SUPP: 10.0000 mg | Freq: Every day | RECTAL | 0 refills | Status: AC | PRN

## 2022-11-08 MED ORDER — MORPHINE SULFATE (PF) 4 MG/ML IV SOLN
4.0000 mg | Freq: Once | INTRAVENOUS | Status: AC
  Administered 2022-11-08: 4 mg via INTRAVENOUS
  Filled 2022-11-08: qty 1

## 2022-11-08 MED ORDER — IOHEXOL 300 MG/ML  SOLN
100.0000 mL | Freq: Once | INTRAMUSCULAR | Status: AC | PRN
  Administered 2022-11-08: 100 mL via INTRAVENOUS

## 2022-11-08 MED ORDER — ONDANSETRON HCL 4 MG/2ML IJ SOLN
4.0000 mg | Freq: Once | INTRAMUSCULAR | Status: AC
  Administered 2022-11-08: 4 mg via INTRAVENOUS
  Filled 2022-11-08: qty 2

## 2022-11-08 NOTE — ED Triage Notes (Addendum)
Patient is here for evaluation of right lower abdominal pain since Saturday. Reports seeing her PCP and was sent her for a CT scan. Pt reports nausea and states she has not had a bowel movement since Saturday. States she did notice with her last bowel movements that there was bright red blood after wiping.
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
## 2022-11-08 NOTE — ED Provider Notes (Signed)
72 yo female    Physical Exam  BP (!) 141/98   Pulse (!) 59   Temp 99.6 F (37.6 C) (Oral)   Resp 18   Ht 5\' 2"  (1.575 m)   Wt 54.4 kg   SpO2 94%   BMI 21.95 kg/m   Physical Exam  Procedures  Procedures  ED Course / MDM    Medical Decision Making Amount and/or Complexity of Data Reviewed Labs: ordered. Radiology: ordered.  Risk OTC drugs. Prescription drug management.   Patient reassessed, feeling comfortable, better from when she came in.  We discussed her CT findings.  Discussed a bowel cleanout.  She has an appointment with the GI doctor this week already set up, which is good follow-up.  We also discussed her hernia seen on CT scan.  This does not correlate with the position of her changing pain on exam, but I do think is reasonable to refer her to a general surgeon to have this evaluated.  There is some bowel involved, but no evidence of entrapment or incarceration.  No sign of bowel obstruction.  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  They verbalized understanding.       Terald Sleeper, MD 11/08/22 [phone]

## 2022-11-08 NOTE — Telephone Encounter (Signed)
  Chief Complaint: Abdominal Pain Symptoms: Right sided, near groin, entire stomach, some nausea. 9/10 with pain meds Frequency: 3 days Pertinent Negatives: Patient denies diarrhea, fever Disposition: [x] ED /[] Urgent Care (no appt availability in office) / [] Appointment(In office/virtual)/ []  [facility]/ [] Home Care/ [] Refused Recommended Disposition /[] [facility]/ []  Follow-up with PCP Additional Notes: Directed pt to ED, states will follow disposition, has driver.  No PCP. Reason for Disposition  [1] SEVERE pain (e.g., excruciating) AND [2] present > 1 hour  Answer Assessment - Initial Assessment Questions 1. LOCATION: "Where does it hurt?"      Right side lower abd, near groin 2. RADIATION: "Does the pain shoot anywhere else?" (e.g., chest, back)     Yes, all of stomach 3. ONSET: "When did the pain begin?" (e.g., minutes, hours or days ago)      3 days  5. PATTERN "Does the pain come and go, or is it constant?"    - If it comes and goes: "How long does it last?" "Do you have pain now?"     (Note: Comes and goes means the pain is intermittent. It goes away completely between bouts.)    - If constant: "Is it getting better, staying the same, or getting worse?"      (Note: Constant means the pain never goes away completely; most serious pain is constant and gets worse.)      Constant 6. SEVERITY: "How bad is the pain?"  (e.g., Scale 1-10; mild, moderate, or severe)    - MILD (1-3): Doesn't interfere with normal activities, abdomen soft and not tender to touch.     - MODERATE (4-7): Interferes with normal activities or awakens from sleep, abdomen tender to touch.     - SEVERE (8-10): Excruciating pain, doubled over, unable to do any normal activities.       9/10 7. RECURRENT SYMPTOM: "Have you ever had this type of stomach pain before?" If Yes, ask: "When was the last time?" and "What happened that time?"           10. OTHER SYMPTOMS: "Do you have any other  symptoms?" (e.g., back pain, diarrhea, fever, urination pain, vomiting)       NO  Protocols used: Abdominal Pain - Female-A-AH

## 2022-11-08 NOTE — ED Notes (Signed)
EDP into room, at [facility]. Husband at [facility].

## 2022-11-08 NOTE — Discharge Instructions (Addendum)
I would recommend a cleanout for constipation.  You can take MiraLAX in the morning with a glass of water, and also use a rectal suppository.  This should stimulate a bowel movement.  Try to add more fiber to your diet.  Please follow-up with a gastroenterologist this week, as scheduled, for your constipation and also discussed the blood in your stool.  You may be due for colonoscopy at your age.  We also talked about the hernia that was seen on your CT scan.  This can sometimes be a cause of pain, particularly when straining, or lifting heavy objects.  You can see a general surgeon for this in the office, please call the number above.  Finally, we talked about the atherosclerosis, or plaque and narrowing of your femoral arteries, seen on CT scan.  This can lead to blood flow problems in your lower legs.  If you begin to develop cramping pain in your legs, particularly after exercise or walking, contact your PCP's office.  You may need referral to a vascular surgeon at that point.  If you ever have sudden, severe pain in your legs, with or without a chance of skin color, or loss of feeling in your legs, come back to the ER immediately.

## 2022-11-10 ENCOUNTER — Other Ambulatory Visit (INDEPENDENT_AMBULATORY_CARE_PROVIDER_SITE_OTHER): Payer: PPO

## 2022-11-10 ENCOUNTER — Encounter: Payer: Self-pay | Admitting: Gastroenterology

## 2022-11-10 ENCOUNTER — Ambulatory Visit: Payer: PPO | Admitting: Gastroenterology

## 2022-11-10 VITALS — BP 240/106 | HR 72 | Ht 59.0 in | Wt 113.2 lb

## 2022-11-10 DIAGNOSIS — K625 Hemorrhage of anus and rectum: Secondary | ICD-10-CM

## 2022-11-10 DIAGNOSIS — R1030 Lower abdominal pain, unspecified: Secondary | ICD-10-CM

## 2022-11-10 DIAGNOSIS — D649 Anemia, unspecified: Secondary | ICD-10-CM

## 2022-11-10 DIAGNOSIS — R194 Change in bowel habit: Secondary | ICD-10-CM

## 2022-11-10 LAB — VITAMIN B12: Vitamin B-12: 280 pg/mL (ref 211–911)

## 2022-11-10 MED ORDER — NA SULFATE-K SULFATE-MG SULF 17.5-3.13-1.6 GM/177ML PO SOLN: 1.0000 | Freq: Once | ORAL | 0 refills | Status: AC

## 2022-11-10 MED ORDER — ONDANSETRON 4 MG PO TBDP: 4.0000 mg | ORAL_TABLET | Freq: Four times a day (QID) | ORAL | 0 refills | Status: AC | PRN

## 2022-11-10 MED ORDER — IBGARD 90 MG PO CPCR: ORAL_CAPSULE | ORAL | 0 refills | Status: AC

## 2022-11-10 NOTE — Patient Instructions (Addendum)
_______________________________________________________  If your blood pressure at your visit was 140/90 or greater, please contact your primary care physician to follow up on this.  _______________________________________________________  If you are age 73 or older, your body mass index should be between 23-30. Your Body mass index is 22.87 kg/m. If this is out of the aforementioned range listed, please consider follow up with your Primary Care Provider.  If you are age 86 or younger, your body mass index should be between 19-25. Your Body mass index is 22.87 kg/m. If this is out of the aformentioned range listed, please consider follow up with your Primary Care Provider.   ________________________________________________________  The [facility] providers would like to encourage you to use [facility] to communicate with providers for non-urgent requests or questions.  Due to long hold times on the telephone, sending your provider a message by [facility] may be a faster and more efficient way to get a response.  Please allow 48 business hours for a response.  Please remember that this is for non-urgent requests.  _______________________________________________________  Lori [name] have been scheduled for a colonoscopy. Please follow written instructions given to you at your visit today.  Please pick up your prep supplies at the pharmacy within the next 1-3 days. If you use inhalers (even only as needed), please bring them with you on the day of your procedure. ______________________________________________________  Dr Adela Lank recommends that you complete a bowel purge (to clean out your bowels). Please do the following: Purchase a bottle of Miralax over the counter as well as a box of 5 mg dulcolax tablets. Take 4 dulcolax tablets. Wait 1 hour. You will then drink 6-8 capfuls of Miralax mixed in an adequate amount of water/juice/gatorade (you may choose which of these liquids to drink) over the next  2-3 hours. You should expect results within 1 to 6 hours after completing the bowel purge. ________________________________________________________  Please go to the lab in the basement of our building to have lab work done as you leave today. Hit "B" for basement when you get on the elevator.  When the doors open the lab is on your left.  We will call you with the results. Thank you.  We have sent the following medications to your pharmacy for you to pick up at your convenience: Zofran 4 mg ODT: dissolve 1 tablet orally every 6 hours as needed  We have given you samples of the following medication to take: IBgard  Thank you for entrusting me with your care and for choosing [facility], Dr. Ileene Patrick
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
## 2022-11-11 ENCOUNTER — Telehealth: Payer: Self-pay | Admitting: Gastroenterology

## 2022-11-11 LAB — IBC + FERRITIN
Ferritin: 69.8 ng/mL (ref 10.0–291.0)
Iron: 22 ug/dL — ABNORMAL LOW (ref 42–145)
Saturation Ratios: 7 % — ABNORMAL LOW (ref 20.0–50.0)
TIBC: 315 ug/dL (ref 250.0–450.0)
Transferrin: 225 mg/dL (ref 212.0–360.0)

## 2022-11-11 NOTE — Telephone Encounter (Signed)
Patient is calling wanting to update saying that she got her blood pressure under control and is feeling better from yesterday.

## 2022-11-11 NOTE — Telephone Encounter (Signed)
Great thank you very much for the update I am happy to hear that.

## 2022-11-12 NOTE — Telephone Encounter (Signed)
Patient states her blood pressure is doing great

## 2022-11-12 NOTE — Telephone Encounter (Signed)
Left message on patient voicemail that we have received message with better blood pressure readings and that Dr Adela Lank had been made aware.

## 2022-11-14 ENCOUNTER — Encounter: Payer: Self-pay | Admitting: Certified Registered Nurse Anesthetist

## 2022-11-14 ENCOUNTER — Ambulatory Visit: Payer: Self-pay

## 2022-11-15 ENCOUNTER — Telehealth: Payer: Self-pay | Admitting: Gastroenterology

## 2022-11-15 NOTE — Telephone Encounter (Signed)
Patient returning call, informed her of Jan's note below

## 2022-11-15 NOTE — Telephone Encounter (Signed)
Called pharmacy. They have Suprep in stock and they have the order we went but they need patient's insurance card.  Pharmacist indicated she would call patient to discuss.

## 2022-11-15 NOTE — Telephone Encounter (Signed)
Inbound call from patient stating her pharmacy, [facility], was not able to fill ibgard medication for procedure on Wednesday 6/12. Also states she is still having trouble with BM's. Requesting a call back to discuss further.

## 2022-11-15 NOTE — Telephone Encounter (Signed)
LM for patient that she needs to contact [facility] re: Suprep.  IBGard was a sample she was given in the office and she can buy more over-the-counter if it helps her. Asked her to call back with any questions

## 2022-11-17 ENCOUNTER — Ambulatory Visit (AMBULATORY_SURGERY_CENTER): Payer: PPO | Admitting: Gastroenterology

## 2022-11-17 ENCOUNTER — Encounter: Payer: Self-pay | Admitting: Gastroenterology

## 2022-11-17 VITALS — BP 158/85 | HR 61 | Temp 98.4°F | Resp 9 | Ht 59.0 in | Wt 113.0 lb

## 2022-11-17 DIAGNOSIS — D125 Benign neoplasm of sigmoid colon: Secondary | ICD-10-CM | POA: Diagnosis not present

## 2022-11-17 DIAGNOSIS — D509 Iron deficiency anemia, unspecified: Secondary | ICD-10-CM

## 2022-11-17 DIAGNOSIS — K625 Hemorrhage of anus and rectum: Secondary | ICD-10-CM

## 2022-11-17 DIAGNOSIS — K635 Polyp of colon: Secondary | ICD-10-CM | POA: Diagnosis not present

## 2022-11-17 DIAGNOSIS — K633 Ulcer of intestine: Secondary | ICD-10-CM

## 2022-11-17 DIAGNOSIS — K5520 Angiodysplasia of colon without hemorrhage: Secondary | ICD-10-CM | POA: Diagnosis not present

## 2022-11-17 DIAGNOSIS — K621 Rectal polyp: Secondary | ICD-10-CM

## 2022-11-17 DIAGNOSIS — R1030 Lower abdominal pain, unspecified: Secondary | ICD-10-CM

## 2022-11-17 MED ORDER — MESALAMINE 1000 MG RE SUPP
1000.0000 mg | Freq: Every day | RECTAL | 1 refills | Status: AC
Start: 2022-11-17 — End: ?

## 2022-11-17 MED ORDER — SODIUM CHLORIDE 0.9 % IV SOLN: 500.0000 mL | Freq: Once | INTRAVENOUS | Status: DC

## 2022-11-17 NOTE — Progress Notes (Signed)
History and Physical Interval Note: Patient seen on 11/10/22 - no interval changes. Symptoms as outlined below, pain had improved with treatment of constipation. No prior colonoscopy. Colonoscopy to further evaluate. Have discussed risks / benefits she wishes to proceed. Otherwise feels well without complaints today    11/17/2022 11:17 AM  Lori [name]  has presented today for endoscopic procedure(s), with the diagnosis of  Encounter Diagnoses  Name Primary?   Rectal bleeding Yes   Lower abdominal pain    Iron deficiency anemia, unspecified iron deficiency anemia type   .  The various methods of evaluation and treatment have been discussed with the patient and/or family. After consideration of risks, benefits and other options for treatment, the patient has consented to  the endoscopic procedure(s).   The patient's history has been reviewed, patient examined, no change in status, stable for surgery.  I have reviewed the patient's chart and labs.  Questions were answered to the patient's satisfaction.    Harlin Rain, MD [facility]

## 2022-11-17 NOTE — Progress Notes (Signed)
Pt's states no medical or surgical changes since previsit or office visit. 

## 2022-11-17 NOTE — Progress Notes (Signed)
Report given to PACU, vss 

## 2022-11-17 NOTE — Progress Notes (Signed)
Called to room to assist during endoscopic procedure.  Patient ID and intended procedure confirmed with present staff. Received instructions for my participation in the procedure from the performing physician.  

## 2022-11-17 NOTE — Patient Instructions (Signed)
Await pathology results.  Continue present medications.  Resume previous diet.  A prescription for Canasa , use nightly, has been sent to your pharmacy.  Handout on polyps provided.  YOU HAD AN ENDOSCOPIC PROCEDURE TODAY AT THE [facility]:   Refer to the procedure report that was given to you for any specific questions about what was found during the examination.  If the procedure report does not answer your questions, please call your gastroenterologist to clarify.  If you requested that your care partner not be given the details of your procedure findings, then the procedure report has been included in a sealed envelope for you to review at your convenience later.  YOU SHOULD EXPECT: Some feelings of bloating in the abdomen. Passage of more gas than usual.  Walking can help get rid of the air that was put into your GI tract during the procedure and reduce the bloating. If you had a lower endoscopy (such as a colonoscopy or flexible sigmoidoscopy) you may notice spotting of blood in your stool or on the toilet paper. If you underwent a bowel prep for your procedure, you may not have a normal bowel movement for a few days.  Please Note:  You might notice some irritation and congestion in your nose or some drainage.  This is from the oxygen used during your procedure.  There is no need for concern and it should clear up in a day or so.  SYMPTOMS TO REPORT IMMEDIATELY:  Following lower endoscopy (colonoscopy or flexible sigmoidoscopy):  Excessive amounts of blood in the stool  Significant tenderness or worsening of abdominal pains  Swelling of the abdomen that is new, acute  Fever of 100F or higher  For urgent or emergent issues, a gastroenterologist can be reached at any hour by calling [phone]. Do not use MyChart messaging for urgent concerns.    DIET:  We do recommend a small meal at first, but then you may proceed to your regular diet.  Drink plenty of fluids but  you should avoid alcoholic beverages for 24 hours.  ACTIVITY:  You should plan to take it easy for the rest of today and you should NOT DRIVE or use heavy machinery until tomorrow (because of the sedation medicines used during the test).    FOLLOW UP: Our staff will call the number listed on your records the next business day following your procedure.  We will call around 7:15- 8:00 am to check on you and address any questions or concerns that you may have regarding the information given to you following your procedure. If we do not reach you, we will leave a message.     If any biopsies were taken you will be contacted by phone or by letter within the next 1-3 weeks.  Please call us at [phone] if you have not heard about the biopsies in 3 weeks.    SIGNATURES/CONFIDENTIALITY: You and/or your care partner have signed paperwork which will be entered into your electronic medical record.  These signatures attest to the fact that that the information above on your After Visit Summary has been reviewed and is understood.  Full responsibility of the confidentiality of this discharge information lies with you and/or your care-partner.

## 2022-11-17 NOTE — Op Note (Signed)
[facility] Patient Name: Lori [name] Procedure Date: 11/17/2022 11:10 AM MRN: [id number] Endoscopist: Viviann Spare P. Adela Lank , MD, [id number] Age: 72 Referring MD:  Date of Birth: 06-01-1951 Gender: Female Account #: [id number] Procedure:                Colonoscopy Indications:              Rectal bleeding, Iron deficiency anemia, Lower                            abdominal pain - pain improved with management of                            constipation - no prior colonoscopy Medicines:                Monitored Anesthesia Care Procedure:                Pre-Anesthesia Assessment:                           - Prior to the procedure, a History and Physical                            was performed, and patient medications and                            allergies were reviewed. The patient's tolerance of                            previous anesthesia was also reviewed. The risks                            and benefits of the procedure and the sedation                            options and risks were discussed with the patient.                            All questions were answered, and informed consent                            was obtained. Prior Anticoagulants: The patient has                            taken no anticoagulant or antiplatelet agents. ASA                            Grade Assessment: II - A patient with mild systemic                            disease. After reviewing the risks and benefits,                            the patient was deemed in satisfactory condition to  undergo the procedure.                           After obtaining informed consent, the colonoscope                            was passed under direct vision. Throughout the                            procedure, the patient's blood pressure, pulse, and                            oxygen saturations were monitored continuously. The                            Olympus  PCF-H190DL (#[id number]) Colonoscope was                            introduced through the anus and advanced to the the                            terminal ileum, with identification of the                            appendiceal orifice and IC valve. The quality of                            the bowel preparation was fair. The terminal ileum,                            ileocecal valve, appendiceal orifice, and rectum                            were photographed. Scope In: 11:24:02 AM Scope Out: 11:43:55 AM Scope Withdrawal Time: 0 hours 15 minutes 44 seconds  Total Procedure Duration: 0 hours 19 minutes 53 seconds  Findings:                 The digital rectal exam findings include an anal                            stricture.                           The terminal ileum appeared normal.                           A single small angiodysplastic lesion was found in                            the ascending colon.                           A large amount of semi-liquid stool was found in  the entire colon, making visualization difficult.                            Lavage of the colon was performed using copious                            amounts of sterile water, resulting in incomplete                            clearance with fair visualization. Residual seeds /                            nuts throughout the colon which could not be                            cleared. No obvious large polyps or mass lesions                            noted, smaller or flat lesions may not have been                            appreciated, especially in dependant portions of                            the colon                           A 3 mm polyp was found in the sigmoid colon. The                            polyp was sessile. The polyp was removed with a                            cold snare. Resection and retrieval were complete.                           Two areas of ulceration  were found in the distal                            rectum. Biopsies were taken with a cold forceps for                            histology.                           The exam was otherwise without abnormality. Complications:            No immediate complications. Estimated blood loss:                            Minimal. Estimated Blood Loss:     Estimated blood loss was minimal. Impression:               - Preparation of the colon was fair as outlined  above.                           - Anal stricture found on digital rectal exam.                           - The examined portion of the ileum was normal.                           - A single colonic angiodysplastic lesion.                           - One 3 mm polyp in the sigmoid colon, removed with                            a cold snare. Resected and retrieved.                           - Ulcers in the distal rectum. Biopsied.                           - The examination was otherwise normal.                           - The GI Genius (intelligent endoscopy module),                            computer-aided polyp detection system powered by AI                            was utilized to detect colorectal polyps through                            enhanced visualization during colonoscopy.                           Findings raise possibility for underlying Crohn's                            disease - will await biopsy results. Recommendation:           - Patient has a contact number available for                            emergencies. The signs and symptoms of potential                            delayed complications were discussed with the                            patient. Return to normal activities tomorrow.                            Written discharge instructions were provided to the  patient.                           - Resume previous diet.                           - Continue  present medications.                           - Start Canasa once nightly                           - Await pathology results.                           - Repeat colonoscopy recommended with double prep                            in upcoming months to ensure high quality exam for                            screening, would also add EGD in light of iron                            deficiency to clear her upper tract Viviann Spare P. Tonji Elliff, MD 11/17/2022 11:52:26 AM This report has been signed electronically.

## 2022-11-18 ENCOUNTER — Telehealth: Payer: Self-pay

## 2022-11-18 NOTE — Telephone Encounter (Signed)
  Follow up Call-     11/17/2022   10:33 AM  Call back number  Post procedure Call Back phone  # [phone]  Permission to leave phone message Yes     Patient questions:  Do you have a fever, pain , or abdominal swelling? No. Pain Score  0 *  Have you tolerated food without any problems? Yes.    Have you been able to return to your normal activities? Yes.    Do you have any questions about your discharge instructions: Diet   No. Medications  No. Follow up visit  No.  Do you have questions or concerns about your Care? No.  Actions: * If pain score is 4 or above: No action needed, pain <4.

## 2022-11-23 ENCOUNTER — Telehealth: Payer: Self-pay | Admitting: Gastroenterology

## 2022-11-23 NOTE — Telephone Encounter (Signed)
PT returning call to discuss results. Please advise 

## 2022-11-24 NOTE — Telephone Encounter (Signed)
Attempted to return call. No answer, voicemail now full. See lab result for additional correspondence.

## 2023-01-17 ENCOUNTER — Encounter: Payer: PPO | Admitting: Family

## 2023-01-17 NOTE — Progress Notes (Signed)
Erroneous encounter-disregard

## 2023-02-04 ENCOUNTER — Encounter: Payer: PPO | Admitting: Family

## 2023-02-04 NOTE — Progress Notes (Signed)
Erroneous encounter-disregard

## 2023-03-01 ENCOUNTER — Encounter: Payer: PPO | Admitting: Gastroenterology

## 2023-04-11 NOTE — Progress Notes (Signed)
  Subjective:    Lori [name] - 72 y.o. female MRN [id number]  Date of birth: [birth year]  HPI  Lori [name] is to establish care. Patient has a PMH significant for ***.    Current issues and/or concerns:  ROS per HPI     Health Maintenance:  - *** Health Maintenance Due  Topic Date Due   Medicare Annual Wellness (AWV)  Never done   Hepatitis C Screening  Never done   DTaP/Tdap/Td (1 - Tdap) Never done   MAMMOGRAM  Never done   Zoster Vaccines- Shingrix (1 of 2) Never done   Pneumonia Vaccine 67+ Years old (1 of 1 - PCV) Never done   DEXA SCAN  Never done   INFLUENZA VACCINE  Never done   COVID-19 Vaccine (1 - 2023-24 season) Never done     Past Medical History: Patient Active Problem List   Diagnosis Date Noted   Incisional hernia, without obstruction or gangrene 10/02/2020   Anxiety 08/13/2019   Gout 08/13/2019   Other spondylosis with radiculopathy, cervical region 06/26/2019   Hypercholesterolemia 05/21/2019   Hypertension 05/21/2019   Hypokalemia 03/03/2016   Chest pain 03/03/2016   Pain in the chest 03/03/2016   Bradycardia 03/03/2016   Nonspecific chest pain    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 She has never used smokeless tobacco. She reports that she does not currently use alcohol. She reports that she does not use drugs.   Family History  family history includes Colon cancer in her maternal grandmother; Heart attack in her father and maternal grandfather; Heart disease in her sister; Kidney failure in her father.   Medications: reviewed and updated   Objective:   Physical Exam There were no vitals taken for this visit. Physical Exam      Assessment & Plan:         Patient was given clear instructions to go to Emergency Department or return to medical center if symptoms don't improve,  worsen, or new problems develop.The patient verbalized understanding.  I discussed the assessment and treatment plan with the patient. The patient was provided an opportunity to ask questions and all were answered. The patient agreed with the plan and demonstrated an understanding of the instructions.   The patient was advised to call back or seek an in-person evaluation if the symptoms worsen or if the condition fails to improve as anticipated.    Ricky Stabs, NP 04/11/2023, 8:13 AM Primary Care at [facility]

## 2023-04-15 ENCOUNTER — Encounter: Payer: PPO | Admitting: Family

## 2023-07-19 ENCOUNTER — Ambulatory Visit: Payer: Self-pay

## 2023-07-21 ENCOUNTER — Ambulatory Visit
Admission: EM | Admit: 2023-07-21 | Discharge: 2023-07-21 | Disposition: A | Discharge status: Home / Self Care | Payer: PPO | Attending: Family Medicine | Admitting: Family Medicine

## 2023-07-21 DIAGNOSIS — I10 Essential (primary) hypertension: Secondary | ICD-10-CM | POA: Diagnosis not present

## 2023-07-21 DIAGNOSIS — M109 Gout, unspecified: Secondary | ICD-10-CM | POA: Diagnosis not present

## 2023-07-21 MED ORDER — DEXAMETHASONE SODIUM PHOSPHATE 10 MG/ML IJ SOLN
10.0000 mg | Freq: Once | INTRAMUSCULAR | Status: AC
  Administered 2023-07-21: 10 mg via INTRAMUSCULAR

## 2023-07-21 MED ORDER — COLCHICINE 0.6 MG PO TABS: 0.6000 mg | ORAL_TABLET | Freq: Two times a day (BID) | ORAL | 0 refills | Status: DC

## 2023-07-21 MED ORDER — KETOROLAC TROMETHAMINE 30 MG/ML IJ SOLN
30.0000 mg | Freq: Once | INTRAMUSCULAR | Status: AC
  Administered 2023-07-21: 30 mg via INTRAMUSCULAR

## 2023-07-21 MED ORDER — PREDNISONE 20 MG PO TABS: 20.0000 mg | ORAL_TABLET | Freq: Two times a day (BID) | ORAL | 0 refills | Status: AC

## 2023-07-21 NOTE — Discharge Instructions (Signed)
Your blood pressure readings today here at urgent care were 209/104, 179/118 and manually 200/102, all are extremely high. Take another dose of your blood pressure medication when you get home tonight, if recheck BP at bedtime and in the morning. If readings are not trending below 180/100, I would like for you to go to the emergency department .  Treating your gout with prednisone 20 mg twice daily for 5 days-start tomorrow, you received a steroid injection here in clinic. You received a Toradol injection for pain.  Okay to start colchicine tonight.
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
## 2023-07-21 NOTE — ED Triage Notes (Signed)
Pt states she has poss gout in left hand that is radiating up arm towards elbow. Symptoms onset yesterday. Pt has Allopurinol at home to help maintain gout. Says she has not been taking them regularly but took one yesterday and today when she felt the episode coming on. She says med didn't give her any relief.

## 2023-08-09 ENCOUNTER — Ambulatory Visit: Payer: PPO | Admitting: Gastroenterology
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
## 2024-01-04 ENCOUNTER — Emergency Department (HOSPITAL_COMMUNITY)

## 2024-01-04 ENCOUNTER — Emergency Department (HOSPITAL_COMMUNITY)
Admission: EM | Admit: 2024-01-04 | Discharge: 2024-01-04 | Disposition: A | Discharge status: Home / Self Care | Source: Ambulatory Visit | Attending: Emergency Medicine | Admitting: Emergency Medicine

## 2024-01-04 ENCOUNTER — Other Ambulatory Visit: Payer: Self-pay

## 2024-01-04 ENCOUNTER — Ambulatory Visit
Admission: EM | Admit: 2024-01-04 | Discharge: 2024-01-04 | Disposition: A | Discharge status: Home / Self Care | Attending: Physician Assistant | Admitting: Physician Assistant

## 2024-01-04 ENCOUNTER — Encounter: Payer: Self-pay | Admitting: Emergency Medicine

## 2024-01-04 DIAGNOSIS — I10 Essential (primary) hypertension: Secondary | ICD-10-CM | POA: Insufficient documentation

## 2024-01-04 DIAGNOSIS — Z79899 Other long term (current) drug therapy: Secondary | ICD-10-CM | POA: Diagnosis not present

## 2024-01-04 DIAGNOSIS — I6782 Cerebral ischemia: Secondary | ICD-10-CM | POA: Insufficient documentation

## 2024-01-04 DIAGNOSIS — M25511 Pain in right shoulder: Secondary | ICD-10-CM | POA: Diagnosis present

## 2024-01-04 DIAGNOSIS — I160 Hypertensive urgency: Secondary | ICD-10-CM

## 2024-01-04 DIAGNOSIS — R1011 Right upper quadrant pain: Secondary | ICD-10-CM | POA: Diagnosis not present

## 2024-01-04 LAB — HEPATIC FUNCTION PANEL
ALT: 12 U/L (ref 0–44)
AST: 19 U/L (ref 15–41)
Albumin: 3.7 g/dL (ref 3.5–5.0)
Alkaline Phosphatase: 94 U/L (ref 38–126)
Bilirubin, Direct: <0.1 mg/dL (ref 0.0–0.2)
Total Bilirubin: 0.5 mg/dL (ref 0.0–1.2)
Total Protein: 8.2 g/dL — ABNORMAL HIGH (ref 6.5–8.1)

## 2024-01-04 LAB — CBC WITH DIFFERENTIAL/PLATELET
Abs Immature Granulocytes: 0.02 K/uL (ref 0.00–0.07)
Basophils Absolute: 0.1 K/uL (ref 0.0–0.1)
Basophils Relative: 1 %
Eosinophils Absolute: 0.3 K/uL (ref 0.0–0.5)
Eosinophils Relative: 3 %
HCT: 37.2 % (ref 36.0–46.0)
Hemoglobin: 12 g/dL (ref 12.0–15.0)
Immature Granulocytes: 0 %
Lymphocytes Relative: 30 %
Lymphs Abs: 2.8 K/uL (ref 0.7–4.0)
MCH: 27.5 pg (ref 26.0–34.0)
MCHC: 32.3 g/dL (ref 30.0–36.0)
MCV: 85.3 fL (ref 80.0–100.0)
Monocytes Absolute: 0.8 K/uL (ref 0.1–1.0)
Monocytes Relative: 9 %
Neutro Abs: 5.4 K/uL (ref 1.7–7.7)
Neutrophils Relative %: 57 %
Platelets: 385 K/uL (ref 150–400)
RBC: 4.36 MIL/uL (ref 3.87–5.11)
RDW: 14 % (ref 11.5–15.5)
WBC: 9.4 K/uL (ref 4.0–10.5)
nRBC: 0 % (ref 0.0–0.2)

## 2024-01-04 LAB — BASIC METABOLIC PANEL WITH GFR
Anion gap: 14 (ref 5–15)
BUN: 11 mg/dL (ref 8–23)
CO2: 24 mmol/L (ref 22–32)
Calcium: 9 mg/dL (ref 8.9–10.3)
Chloride: 94 mmol/L — ABNORMAL LOW (ref 98–111)
Creatinine, Ser: 0.71 mg/dL (ref 0.44–1.00)
GFR, Estimated: >60 mL/min (ref >60)
Glucose, Bld: 140 mg/dL — ABNORMAL HIGH (ref 70–99)
Potassium: 3.6 mmol/L (ref 3.5–5.1)
Sodium: 132 mmol/L — ABNORMAL LOW (ref 135–145)

## 2024-01-04 LAB — TROPONIN I (HIGH SENSITIVITY)
Troponin I (High Sensitivity): 5 ng/L (ref <18)
Troponin I (High Sensitivity): 5 ng/L (ref <18)

## 2024-01-04 LAB — LIPASE, BLOOD: Lipase: 49 U/L (ref 11–51)

## 2024-01-04 MED ORDER — HYDROMORPHONE HCL 1 MG/ML IJ SOLN
1.0000 mg | Freq: Once | INTRAMUSCULAR | Status: AC
  Administered 2024-01-04: 1 mg via INTRAVENOUS
  Filled 2024-01-04: qty 1

## 2024-01-04 MED ORDER — IOHEXOL 350 MG/ML SOLN
100.0000 mL | Freq: Once | INTRAVENOUS | Status: AC | PRN
Start: 2024-01-04 — End: 2024-01-04
  Administered 2024-01-04: 100 mL via INTRAVENOUS

## 2024-01-04 MED ORDER — HYDRALAZINE HCL 20 MG/ML IJ SOLN
5.0000 mg | Freq: Once | INTRAMUSCULAR | Status: AC
  Administered 2024-01-04: 5 mg via INTRAVENOUS
  Filled 2024-01-04: qty 1

## 2024-01-04 MED ORDER — ONDANSETRON HCL 4 MG/2ML IJ SOLN
4.0000 mg | Freq: Once | INTRAMUSCULAR | Status: AC
  Administered 2024-01-04: 4 mg via INTRAVENOUS
  Filled 2024-01-04: qty 2

## 2024-01-04 NOTE — ED Provider Notes (Signed)
 [place] EMERGENCY DEPARTMENT AT [facility] Provider Note   [name]: [id number] Arrival date & time: 01/04/24  1747     Patient presents with: Hypertension and Shoulder Pain   Lori [name] is a 73 y.o. female.   The history is provided by the patient, the spouse and medical records. No language interpreter was used.  Hypertension  Shoulder Pain    73 year old female history of hypertension currently on lisinopril , presenting with concerns of elevated blood pressure.  Patient states she noticed pain to the right side of her abdomen that radiates to the right shoulder and back since yesterday.  Pain was quite intense, seems to subside a last night but returns this morning.  Now she also endorsed having some throbbing headache, slight blurry vision.  She took her blood pressure at home and it was reading at 170 systolic which is much higher than her usual blood pressure that she takes on a regular basis.  She went to urgent care center and noted that her blood pressure was quite elevated and patient was sent here for further care.  She denies having any fever or chills no runny nose sneezing or coughing no vomiting no diarrhea no dysuria.  No prior abdominal surgery.  She denies alcohol or tobacco use or any drug use.  No recent change in medication.  She denies any focal numbness or focal weakness.  She initially attributed her pain to her exercise but states she exercise on a fairly regular basis and had did not do anything more than usual  Prior to Admission medications   Medication Sig Start Date End Date Taking? Authorizing Provider  allopurinol  (ZYLOPRIM ) 100 MG tablet Take 100 mg by mouth as needed. 06/30/18   [provider]  b complex vitamins tablet Take 1 tablet by mouth daily.    [provider]  bisacodyl  (DULCOLAX) 10 MG suppository Place 1 suppository (10 mg total) rectally daily as needed for up to 10 doses for moderate constipation. 11/08/22    Cottie Donnice PARAS, MD  colchicine  0.6 MG tablet Take 1 tablet (0.6 mg total) by mouth 2 (two) times daily for 7 days. 07/21/23 07/28/23  Arloa Suzen RAMAN, NP  lisinopril  (ZESTRIL ) 20 MG tablet Take 20 mg by mouth daily. 11/10/22   [provider]  mesalamine  (CANASA ) 1000 MG suppository Place 1 suppository (1,000 mg total) rectally at bedtime. 11/17/22   Armbruster, Elspeth SQUIBB, MD  ondansetron  (ZOFRAN -ODT) 4 MG disintegrating tablet Take 1 tablet (4 mg total) by mouth every 6 (six) hours as needed for nausea or vomiting. 11/10/22   Armbruster, Elspeth SQUIBB, MD  Peppermint Oil (IBGARD) 90 MG CPCR Take as directed 11/10/22   Armbruster, Elspeth SQUIBB, MD  POTASSIUM PO Take 1 capsule by mouth daily.    [provider]    Allergies: Patient has no known allergies.    Review of Systems  All other systems reviewed and are negative.   Updated Vital Signs BP (!) 247/96   Pulse 65   Temp 98.6 F (37 C) (Oral)   Resp 17   Ht 5' 3 (1.6 m)   Wt 54.4 kg   SpO2 100%   BMI 21.26 kg/m   Physical Exam Vitals and nursing note reviewed.  Constitutional:      General: She is not in acute distress.    Appearance: She is well-developed.  HENT:     Head: Atraumatic.  Eyes:     Conjunctiva/sclera: Conjunctivae normal.  Cardiovascular:  Rate and Rhythm: Normal rate and regular rhythm.     Pulses: Normal pulses.     Heart sounds: Normal heart sounds.  Pulmonary:     Effort: Pulmonary effort is normal.  Abdominal:     Palpations: There is no mass.     Tenderness: There is abdominal tenderness (Tenderness to right upper quadrant on palpation no guarding rebound tenderness no pulsatile mass). There is no right CVA tenderness or left CVA tenderness.  Musculoskeletal:        General: Tenderness (R shoulder: mild tenderness, FROM, sensation intact.  radial pulse 2+) present. Normal range of motion.     Cervical back: Neck supple.  Skin:    Capillary Refill: Capillary refill takes less than 2  seconds.     Findings: No rash.  Neurological:     Mental Status: She is alert and oriented to person, place, and time. Mental status is at baseline.  Psychiatric:        Mood and Affect: Mood normal.     (all labs ordered are listed, but only abnormal results are displayed) Labs Reviewed  BASIC METABOLIC PANEL WITH GFR - Abnormal; Notable for the following components:      Result Value   Sodium 132 (*)    Chloride 94 (*)    Glucose, Bld 140 (*)    All other components within normal limits  HEPATIC FUNCTION PANEL - Abnormal; Notable for the following components:   Total Protein 8.2 (*)    All other components within normal limits  CBC WITH DIFFERENTIAL/PLATELET  LIPASE, BLOOD  TROPONIN I (HIGH SENSITIVITY)  TROPONIN I (HIGH SENSITIVITY)    EKG: EKG Interpretation Date/Time:  Wednesday January 04 2024 18:15:32 EDT Ventricular Rate:  58 PR Interval:  208 QRS Duration:  101 QT Interval:  427 QTC Calculation: 420 R Axis:   20  Text Interpretation: Sinus rhythm Abnormal R-wave progression, early transition No significant change since last tracing Confirmed by Dreama Longs ([id number]) on 01/04/2024 7:46:51 PM  Radiology: CT Angio Chest/Abd/Pel for Dissection W and/or Wo Contrast Result Date: 01/04/2024 CLINICAL DATA:  Acute aortic syndrome suspected. Hypertension. Shoulder pain. EXAM: CT ANGIOGRAPHY CHEST, ABDOMEN AND PELVIS TECHNIQUE: Non-contrast CT of the chest was initially obtained. Multidetector CT imaging through the chest, abdomen and pelvis was performed using the standard protocol during bolus administration of intravenous contrast. Multiplanar reconstructed images and MIPs were obtained and reviewed to evaluate the vascular anatomy. RADIATION DOSE REDUCTION: This exam was performed according to the departmental dose-optimization program which includes automated exposure control, adjustment of the mA and/or kV according to patient size and/or use of iterative reconstruction  technique. CONTRAST:  OMNIPAQUE  IOHEXOL  350 MG/ML SOLN COMPARISON:  CT abdomen and pelvis 11/08/2022. CT of the chest 03/30/2022. FINDINGS: CTA CHEST FINDINGS Cardiovascular: Preferential opacification of the thoracic aorta. No evidence of thoracic aortic aneurysm or dissection. Heart is mildly enlarged. No pericardial effusion. There are atherosclerotic calcifications throughout the aorta. There is no central pulmonary embolism. Mediastinum/Nodes: No enlarged mediastinal, hilar, or axillary lymph nodes. Thyroid  gland, trachea, and esophagus demonstrate no significant findings. Lungs/Pleura: There are linear bands of opacity and minimal ground-glass opacities in both lung bases. The lungs are otherwise clear. There is no pleural effusion or pneumothorax. Musculoskeletal: Degenerative changes affect the spine. Review of the MIP images confirms the above findings. CTA ABDOMEN AND PELVIS FINDINGS VASCULAR Aorta: Normal caliber aorta without aneurysm, dissection, vasculitis or significant stenosis. There are atherosclerotic calcifications of the aorta. Celiac: There is mild  stenosis of the origin of the celiac axis secondary to calcified atherosclerotic disease. Celiac artery is otherwise within limits. SMA: Patent without evidence of aneurysm, dissection, vasculitis or significant stenosis. Renals: Both renal arteries are patent without evidence of aneurysm, dissection, vasculitis, fibromuscular dysplasia or significant stenosis. IMA: Patent without evidence of aneurysm, dissection, vasculitis or significant stenosis. Inflow: Patent without evidence of aneurysm, dissection, vasculitis or significant stenosis. Atherosclerotic calcifications are present. Veins: No obvious venous abnormality within the limitations of this arterial phase study. Review of the MIP images confirms the above findings. NON-VASCULAR Hepatobiliary: No focal liver abnormality is seen. No gallstones, gallbladder wall thickening, or biliary  dilatation. Pancreas: Unremarkable. No pancreatic ductal dilatation or surrounding inflammatory changes. Spleen: Normal in size without focal abnormality. Adrenals/Urinary Tract: Adrenal glands are unremarkable. Kidneys are normal, without renal calculi, focal lesion, or hydronephrosis. Bladder is unremarkable. Stomach/Bowel: Stomach is within normal limits. Appendix appears normal. No evidence of bowel wall thickening, distention, or inflammatory changes. There is a large amount of stool throughout the colon. Fatty lesion is seen in the distal duodenum measuring 1.7 x 3.3 x 1.4 cm. Lymphatic: No enlarged lymph nodes are identified. Reproductive: Uterus and bilateral adnexa are unremarkable. Other: There is a small ventral hernia midline inferior to the level of the umbilicus containing a loop of nondilated bowel. This is unchanged from prior. There is no ascites. Musculoskeletal: Degenerative changes affect the hips, sacroiliac joints and spine. Are multiple radiopaque bodies within soft tissues back similar to prior. Review of the MIP images confirms the above findings. IMPRESSION: 1. No evidence for aortic aneurysm or dissection. 2. Mild cardiomegaly. 3. Bibasilar atelectasis. 4. Large amount of stool throughout the colon. 5. Stable ventral hernia containing a loop of nondilated bowel. 6. Fatty lesion in the distal duodenum measuring 1.7 x 3.3 x 1.4 cm. 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 Aortic Atherosclerosis (ICD10-I70.0). Electronically Signed   By: Greig Pique M.D.   On: 01/04/2024 21:12   CT Head Wo Contrast Result Date: 01/04/2024 CLINICAL DATA:  Hypertension, visual disturbance, dizziness EXAM: CT HEAD WITHOUT CONTRAST TECHNIQUE: Contiguous axial images were obtained from the base of the skull through the vertex without intravenous contrast. RADIATION DOSE REDUCTION: This exam was performed according to the departmental dose-optimization program which includes automated exposure control, adjustment of  the mA and/or kV according to patient size and/or use of iterative reconstruction technique. 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 No acute intracranial abnormality. Specifically, no hemorrhage, hydrocephalus, mass lesion, acute infarction, or significant intracranial injury. Vascular: No hyperdense vessel or unexpected calcification. Skull: No acute calvarial abnormality. 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 No acute intracranial abnormality. Electronically Signed   By: Franky Crease M.D.   On: 01/04/2024 18:27     Procedures   Medications Ordered in the ED  hydrALAZINE  (APRESOLINE ) injection 5 mg (5 mg Intravenous Given 01/04/24 1852)  HYDROmorphone  (DILAUDID ) injection 1 mg (1 mg Intravenous Given 01/04/24 1929)  ondansetron  (ZOFRAN ) injection 4 mg (4 mg Intravenous Given 01/04/24 1929)  iohexol  (OMNIPAQUE ) 350 MG/ML injection 100 mL (100 mLs Intravenous Contrast Given 01/04/24 2046)                                    Medical Decision Making Amount and/or Complexity of Data Reviewed Labs: ordered. Radiology: ordered.  Risk Prescription drug management.   BP (!) 220/101   Pulse 70   Temp 98.6 F (37 C) (Oral)   Resp 16   Ht 5' 3 (1.6 m)   Wt 54.4 kg   SpO2 99%   BMI 21.26 kg/m   28:26 PM  73 year old female history of hypertension currently on lisinopril , presenting with concerns of elevated blood pressure.  Patient states she noticed pain to the right side of her abdomen that radiates to the right shoulder and back since yesterday.  Pain was quite intense, seems to subside a last night but returns this morning.  Now she also endorsed having some throbbing headache, slight blurry vision.  She took her blood pressure at home and it was reading at 170 systolic which is much higher than her usual blood pressure that  she takes on a regular basis.  She went to urgent care center and noted that her blood pressure was quite elevated and patient was sent here for further care.  She denies having any fever or chills no runny nose sneezing or coughing no vomiting no diarrhea no dysuria.  No prior abdominal surgery.  She denies alcohol or tobacco use or any drug use.  No recent change in medication.  She denies any focal numbness or focal weakness.  She initially attributed her pain to her exercise but states she exercise on a fairly regular basis and had did not do anything more than usual  On exam patient is laying bed appears to be in no acute discomfort.  Heart with normal rate and rhythm, lungs are clear abdomen is soft but tenderness to right upper quadrant without guarding or rebound tenderness.  No pulsatile mass.  She has intact distal pulses and equal strength throughout.  Blood pressure is significantly elevated at 245 systolic.  Although this could be related to pain, will perform a dissection study for further assessment.  -Labs ordered, independently viewed and interpreted by me.  Labs remarkable for reassuring labs -The patient was maintained on a cardiac monitor.  I personally viewed and interpreted the cardiac monitored which showed an underlying rhythm of: Sinus bradycardia -Imaging independently viewed and interpreted by me and I agree with radiologist's interpretation.  Result remarkable for head CT scan along with CT angiogram of the chest abdomen pelvis fortunately did not show any concerning finding.  No evidence of dissection or aneurysm.  No intracranial hemorrhage -This patient presents to the ED for concern of hypertension, this involves an extensive number of treatment options, and is a complaint that carries with it a high risk of complications and morbidity.  The differential diagnosis includes pain induced hypertension, hypertensive emergency, hypertensive urgency, cholecystitis, appendicitis,  dissection -Co morbidities that complicate the patient evaluation includes hypertension -Treatment includes hydralazine , Dilaudid , Zofran  -Reevaluation of the patient after these medicines showed that the patient improved -PCP office notes or outside notes reviewed -Escalation to admission/observation considered: patients feels much better, is comfortable with discharge, and will follow up with PCP -Prescription medication considered, patient comfortable with flexeril , tylenol  -Social Determinant of Health considered       Final diagnoses:  Hypertension, unspecified type  Acute pain of right shoulder    ED Discharge Orders     None          Nivia Colon, PA-C 01/04/24 2219    Dreama Longs, MD 01/05/24 1025

## 2024-01-04 NOTE — ED Notes (Signed)
 Pt placed on 2L [place] due to low O2 saturation after pain medication

## 2024-01-04 NOTE — ED Provider Triage Note (Cosign Needed Addendum)
 Emergency Medicine Provider Triage Evaluation Note  Lori [name] , a 73 y.o. female  was evaluated in triage.  Pt complains of elevated blood pressure, currently 247/96. Also appreciates dizziness with possible vision changes. Also complaining of R shoulder pain.   Review of Systems  Positive: Dizziness, hypertension, vision changes, R shoulder pain Negative: Fever, chills, chest pain, shortness of breath  Physical Exam  BP (!) 247/96   Pulse 65   Temp 98.6 F (37 C) (Oral)   Resp 17   Ht 5' 3 (1.6 m)   Wt 54.4 kg   SpO2 100%   BMI 21.26 kg/m  Gen:   Awake, no distress   Resp:  Normal effort  MSK:   Moves extremities without difficulty  Other:  Grossly neurologically intact, no evidence of facial droop, slurred speech, or issues with mentation. Patient alert and oriented talking in full sentences. No obvious weakness.   Medical Decision Making  Medically screening exam initiated at 6:09 PM.  Appropriate orders placed.  Lori [name] was informed that the remainder of the evaluation will be completed by another provider, this initial triage assessment does not replace that evaluation, and the importance of remaining in the ED until their evaluation is complete.  Orders: CBC, BMP, troponin, head CT, EKG, hydralazine  ordered    Janetta Terrall FALCON, PA-C 01/04/24 1811    Janetta Terrall FALCON, [place] 01/04/24 1822

## 2024-01-04 NOTE — ED Triage Notes (Signed)
 Pt presents c/o hypertension. Pt says the sxs on set yesterday afternoon with shoulder pain. Pt reports today around noon while doing work she started feeling fuzzy in the head. Pt says she just knew something was wrong.

## 2024-01-04 NOTE — ED Notes (Signed)
 Patient transported to CT

## 2024-01-04 NOTE — ED Provider Notes (Signed)
 Patient presents to UC with significant elevated BP, right shoulder discomfort and reported fuzzy feeling in her head. I recommended further evaluation in the ED. EKG similar to prior. Patient is agreeable with plan. Husband to transport via POV.    Billy Asberry FALCON, PA-C 01/04/24 1705

## 2024-01-04 NOTE — Discharge Instructions (Signed)
 You have been evaluated for your symptoms.  Although your blood pressure was quite high today it has improved with blood pressure medication and pain medication.  Your elevated blood pressure may be due to the pain response.  Fortunately CT scan today did not show any acute concerning finding.  Please continue to take your blood pressure medication, take medication prescribed and follow-up closely with your doctor for recheck.  Return if you have any concern.

## 2024-01-04 NOTE — ED Notes (Signed)
 Patient is being discharged from the Urgent Care and sent to the Emergency Department via POV with family . Per Billy, PA-C, patient is in need of higher level of care due to need for further evaluation of symptomatic hypertension. Patient is aware and verbalizes understanding of plan of care.  Vitals:   01/04/24 1641  BP: (!) 214/95  Pulse: 69  Resp: 18  Temp: 99.1 F (37.3 C)  SpO2: 98%

## 2024-01-04 NOTE — ED Triage Notes (Signed)
 Patient to ED by POV with c/o HTN. She has a HX of HTN takes Lisinopril  40mg  daily, did take hers this AM. She voices headache and blurred vision denies N/V. Also c/o right shoulder pain that radiates into her back that started yesterday.

## 2024-01-05 ENCOUNTER — Ambulatory Visit: Payer: Self-pay

## 2024-01-07 ENCOUNTER — Emergency Department (HOSPITAL_COMMUNITY)

## 2024-01-07 ENCOUNTER — Encounter: Payer: Self-pay | Admitting: Emergency Medicine

## 2024-01-07 ENCOUNTER — Emergency Department (HOSPITAL_COMMUNITY)
Admission: EM | Admit: 2024-01-07 | Discharge: 2024-01-07 | Disposition: A | Discharge status: Home / Self Care | Attending: Emergency Medicine | Admitting: Emergency Medicine

## 2024-01-07 ENCOUNTER — Other Ambulatory Visit: Payer: Self-pay

## 2024-01-07 ENCOUNTER — Ambulatory Visit: Admission: EM | Admit: 2024-01-07 | Discharge: 2024-01-07 | Disposition: A | Discharge status: Home / Self Care

## 2024-01-07 ENCOUNTER — Encounter (HOSPITAL_COMMUNITY): Payer: Self-pay

## 2024-01-07 DIAGNOSIS — M546 Pain in thoracic spine: Secondary | ICD-10-CM | POA: Insufficient documentation

## 2024-01-07 DIAGNOSIS — R202 Paresthesia of skin: Secondary | ICD-10-CM | POA: Diagnosis not present

## 2024-01-07 DIAGNOSIS — R072 Precordial pain: Secondary | ICD-10-CM | POA: Diagnosis not present

## 2024-01-07 DIAGNOSIS — R079 Chest pain, unspecified: Secondary | ICD-10-CM | POA: Diagnosis not present

## 2024-01-07 DIAGNOSIS — R519 Headache, unspecified: Secondary | ICD-10-CM | POA: Diagnosis present

## 2024-01-07 DIAGNOSIS — I1A0 Resistant hypertension: Secondary | ICD-10-CM | POA: Insufficient documentation

## 2024-01-07 DIAGNOSIS — Z79899 Other long term (current) drug therapy: Secondary | ICD-10-CM | POA: Insufficient documentation

## 2024-01-07 DIAGNOSIS — M542 Cervicalgia: Secondary | ICD-10-CM | POA: Insufficient documentation

## 2024-01-07 DIAGNOSIS — I10 Essential (primary) hypertension: Secondary | ICD-10-CM

## 2024-01-07 LAB — CBC WITH DIFFERENTIAL/PLATELET
Abs Immature Granulocytes: 0.02 K/uL (ref 0.00–0.07)
Basophils Absolute: 0.1 K/uL (ref 0.0–0.1)
Basophils Relative: 1 %
Eosinophils Absolute: 0.4 K/uL (ref 0.0–0.5)
Eosinophils Relative: 4 %
HCT: 32.1 % — ABNORMAL LOW (ref 36.0–46.0)
Hemoglobin: 10.5 g/dL — ABNORMAL LOW (ref 12.0–15.0)
Immature Granulocytes: 0 %
Lymphocytes Relative: 28 %
Lymphs Abs: 2.5 K/uL (ref 0.7–4.0)
MCH: 27.7 pg (ref 26.0–34.0)
MCHC: 32.7 g/dL (ref 30.0–36.0)
MCV: 84.7 fL (ref 80.0–100.0)
Monocytes Absolute: 0.8 K/uL (ref 0.1–1.0)
Monocytes Relative: 9 %
Neutro Abs: 4.9 K/uL (ref 1.7–7.7)
Neutrophils Relative %: 58 %
Platelets: 345 K/uL (ref 150–400)
RBC: 3.79 MIL/uL — ABNORMAL LOW (ref 3.87–5.11)
RDW: 14 % (ref 11.5–15.5)
WBC: 8.7 K/uL (ref 4.0–10.5)
nRBC: 0 % (ref 0.0–0.2)

## 2024-01-07 LAB — COMPREHENSIVE METABOLIC PANEL WITH GFR
ALT: 11 U/L (ref 0–44)
AST: 19 U/L (ref 15–41)
Albumin: 3.3 g/dL — ABNORMAL LOW (ref 3.5–5.0)
Alkaline Phosphatase: 73 U/L (ref 38–126)
Anion gap: 9 (ref 5–15)
BUN: 7 mg/dL — ABNORMAL LOW (ref 8–23)
CO2: 25 mmol/L (ref 22–32)
Calcium: 8.5 mg/dL — ABNORMAL LOW (ref 8.9–10.3)
Chloride: 99 mmol/L (ref 98–111)
Creatinine, Ser: 0.76 mg/dL (ref 0.44–1.00)
GFR, Estimated: >60 mL/min (ref >60)
Glucose, Bld: 133 mg/dL — ABNORMAL HIGH (ref 70–99)
Potassium: 3.4 mmol/L — ABNORMAL LOW (ref 3.5–5.1)
Sodium: 133 mmol/L — ABNORMAL LOW (ref 135–145)
Total Bilirubin: <0.2 mg/dL (ref 0.0–1.2)
Total Protein: 7.1 g/dL (ref 6.5–8.1)

## 2024-01-07 LAB — I-STAT CHEM 8, ED
BUN: 7 mg/dL — ABNORMAL LOW (ref 8–23)
Calcium, Ion: 1.09 mmol/L — ABNORMAL LOW (ref 1.15–1.40)
Chloride: 96 mmol/L — ABNORMAL LOW (ref 98–111)
Creatinine, Ser: 0.8 mg/dL (ref 0.44–1.00)
Glucose, Bld: 133 mg/dL — ABNORMAL HIGH (ref 70–99)
HCT: 32 % — ABNORMAL LOW (ref 36.0–46.0)
Hemoglobin: 10.9 g/dL — ABNORMAL LOW (ref 12.0–15.0)
Potassium: 3.5 mmol/L (ref 3.5–5.1)
Sodium: 134 mmol/L — ABNORMAL LOW (ref 135–145)
TCO2: 26 mmol/L (ref 22–32)

## 2024-01-07 LAB — TROPONIN I (HIGH SENSITIVITY)
Troponin I (High Sensitivity): 7 ng/L (ref <18)
Troponin I (High Sensitivity): 7 ng/L (ref <18)

## 2024-01-07 MED ORDER — METOCLOPRAMIDE HCL 5 MG/ML IJ SOLN
10.0000 mg | Freq: Once | INTRAMUSCULAR | Status: AC
  Administered 2024-01-07: 10 mg via INTRAVENOUS
  Filled 2024-01-07: qty 2

## 2024-01-07 MED ORDER — AMLODIPINE BESYLATE 5 MG PO TABS
10.0000 mg | ORAL_TABLET | Freq: Once | ORAL | Status: AC
  Administered 2024-01-07: 10 mg via ORAL
  Filled 2024-01-07: qty 2

## 2024-01-07 MED ORDER — DIPHENHYDRAMINE HCL 50 MG/ML IJ SOLN
25.0000 mg | Freq: Once | INTRAMUSCULAR | Status: AC
  Administered 2024-01-07: 25 mg via INTRAVENOUS
  Filled 2024-01-07: qty 1

## 2024-01-07 MED ORDER — IOHEXOL 350 MG/ML SOLN
75.0000 mL | Freq: Once | INTRAVENOUS | Status: AC | PRN
  Administered 2024-01-07: 75 mL via INTRAVENOUS

## 2024-01-07 MED ORDER — LIDOCAINE 5 % EX PTCH
2.0000 | MEDICATED_PATCH | CUTANEOUS | Status: DC
  Administered 2024-01-07: 2 via TRANSDERMAL
  Filled 2024-01-07: qty 2

## 2024-01-07 MED ORDER — AMLODIPINE BESYLATE 10 MG PO TABS
10.0000 mg | ORAL_TABLET | Freq: Every day | ORAL | 0 refills | Status: AC
Start: 2024-01-07 — End: ?

## 2024-01-07 MED ORDER — HYDRALAZINE HCL 20 MG/ML IJ SOLN
10.0000 mg | Freq: Once | INTRAMUSCULAR | Status: AC
  Administered 2024-01-07: 10 mg via INTRAVENOUS
  Filled 2024-01-07: qty 1

## 2024-01-07 NOTE — ED Triage Notes (Signed)
 Pt sent from UC for further eval of headache, neck pain and chest pain. Pt is hypertensive

## 2024-01-07 NOTE — ED Provider Notes (Signed)
 [place] EMERGENCY DEPARTMENT AT [facility] Provider Note   [name]: [id number] Arrival date & time: 01/07/24  1610     Patient presents with: Neck Pain and Chest Pain   Lori [name] is a 73 y.o. female history of hypertension here presenting with headache and neck pain and chest pain.  Patient was seen here 3 days ago for mostly chest pain and blurry vision.  Patient had a negative dissection study and also negative Noncon CT head.  Patient was given hydralazine  and pain medicine and felt better.  Patient states that she notes her blood pressure has been elevated in the 170s since the ED visit.  She states that this morning she had severe headache and also neck pain.  Patient took her blood pressure and was in the 170s.  Patient also had some upper back pain and chest pain as well.  Went to urgent care and blood pressure was over 200.  Patient was sent here for further evaluation.  Of note after the last visit, patient's blood pressure medicine was not changed.  She is taking her lisinopril  40 mg daily as prescribed.  She is not on any other blood pressure medicine   The history is provided by the patient.       Prior to Admission medications   Medication Sig Start Date End Date Taking? Authorizing Provider  allopurinol  (ZYLOPRIM ) 100 MG tablet Take 100 mg by mouth as needed. 06/30/18   [provider]  b complex vitamins tablet Take 1 tablet by mouth daily.    [provider]  bisacodyl  (DULCOLAX) 10 MG suppository Place 1 suppository (10 mg total) rectally daily as needed for up to 10 doses for moderate constipation. 11/08/22   Cottie Donnice PARAS, MD  colchicine  0.6 MG tablet Take 1 tablet (0.6 mg total) by mouth 2 (two) times daily for 7 days. 07/21/23 07/28/23  Arloa Suzen RAMAN, NP  lisinopril  (ZESTRIL ) 20 MG tablet Take 20 mg by mouth daily. 11/10/22   [provider]  lisinopril  (ZESTRIL ) 40 MG tablet Take 40 mg by mouth daily. 10/18/23   [provider]  mesalamine  (CANASA ) 1000 MG suppository Place 1 suppository (1,000 mg total) rectally at bedtime. 11/17/22   Armbruster, Elspeth SQUIBB, MD  ondansetron  (ZOFRAN -ODT) 4 MG disintegrating tablet Take 1 tablet (4 mg total) by mouth every 6 (six) hours as needed for nausea or vomiting. 11/10/22   Armbruster, Elspeth SQUIBB, MD  Peppermint Oil (IBGARD) 90 MG CPCR Take as directed 11/10/22   Armbruster, Elspeth SQUIBB, MD  POTASSIUM PO Take 1 capsule by mouth daily.    [provider]    Allergies: Patient has no known allergies.    Review of Systems  Cardiovascular:  Positive for chest pain.  Musculoskeletal:  Positive for neck pain.  Neurological:  Positive for headaches.  All other systems reviewed and are negative.   Updated Vital Signs BP (!) 224/123 (BP Location: Left Arm)   Pulse 72   Temp 98.6 F (37 C) (Oral)   Resp 15   SpO2 100%   Physical Exam Vitals and nursing note reviewed.  Constitutional:      Appearance: She is well-developed.  HENT:     Head: Normocephalic.  Eyes:     Extraocular Movements: Extraocular movements intact.     Pupils: Pupils are equal, round, and reactive to light.  Cardiovascular:     Rate and Rhythm: Normal rate and regular rhythm.     Heart sounds:  Normal heart sounds.  Pulmonary:     Breath sounds: Normal breath sounds.  Abdominal:     General: Bowel sounds are normal.     Palpations: Abdomen is soft.  Musculoskeletal:        General: Normal range of motion.     Cervical back: Normal range of motion and neck supple.  Skin:    General: Skin is warm.     Capillary Refill: Capillary refill takes less than 2 seconds.  Neurological:     General: No focal deficit present.     Mental Status: She is alert and oriented to person, place, and time.     Comments: Cranial nerves II to XII intact.  Normal strength and sensation bilateral arms and legs.  Psychiatric:        Mood and Affect: Mood normal.        Behavior: Behavior normal.      (all labs ordered are listed, but only abnormal results are displayed) Labs Reviewed  CBC WITH DIFFERENTIAL/PLATELET  COMPREHENSIVE METABOLIC PANEL WITH GFR  I-STAT CHEM 8, ED  TROPONIN I (HIGH SENSITIVITY)    EKG: None  Radiology: No results found.   Procedures   Medications Ordered in the ED  hydrALAZINE  (APRESOLINE ) injection 10 mg (has no administration in time range)  metoCLOPramide  (REGLAN ) injection 10 mg (has no administration in time range)  diphenhydrAMINE  (BENADRYL ) injection 25 mg (has no administration in time range)  amLODipine  (NORVASC ) tablet 10 mg (has no administration in time range)                                    Medical Decision Making [name] is a 73 y.o. female here presenting with headache and neck pain and chest pain.  I think likely hypertensive urgency.  Will get CTA head and neck to rule out carotid or vertebral dissection.  Will also get troponin x 2 to rule out MI.  I have a low suspicion for aortic dissection as patient has a negative dissection study recently.  I think likely symptomatic hypertension.  Will give hydralazine  and start Norvasc .  Will also give migraine cocktail and reassess    8:07 PM I reviewed patient's labs and troponin negative x 2.  Electrolytes unremarkable.  CTA head and neck showed no carotid dissection.  Patient does have multiple areas of stenosis but has no neurodeficit.  Blood pressure came down to the 150s after hydralazine  and Norvasc .  Will start patient on Norvasc  10 mg daily in addition to lisinopril .  Patient has PCP follow-up on Monday  Problems Addressed: Nonintractable headache, unspecified chronicity pattern, unspecified headache type: acute illness or injury Resistant hypertension: chronic illness or injury  Amount and/or Complexity of Data Reviewed Labs: ordered. Decision-making details documented in ED Course. Radiology: ordered and independent interpretation performed. Decision-making  details documented in ED Course.  Risk Prescription drug management.     Final diagnoses:  None    ED Discharge Orders     None          Patt Alm Macho, MD 01/07/24 2009

## 2024-01-07 NOTE — Discharge Instructions (Addendum)
 Due to the significantly elevated blood pressure, chest pain and left sided head and facial symptoms, we recommend further evaluation at the emergency room where more advanced imaging and testing can be performed

## 2024-01-07 NOTE — ED Notes (Signed)
 Patient is being discharged from the Urgent Care and sent to the Emergency Department via private vehicle . Per Almarie Pizza PA, patient is in need of higher level of care due to HTN. Patient is aware and verbalizes understanding of plan of care.  Vitals:   01/07/24 1453  BP: (!) 234/127  Pulse: 69  Resp: 18  Temp: 98.3 F (36.8 C)  SpO2: 97%

## 2024-01-07 NOTE — Discharge Instructions (Signed)
 As we discussed, your imaging studies did not show any significant findings   Take norvasc  10 mg daily. Continue lisinopril  40 mg daily   See your doctor on Monday as scheduled   Return to ER if you have worse headache, neck pain, chest pain

## 2024-01-07 NOTE — ED Triage Notes (Addendum)
 Pt c/o head not feeling right also st' left side of neck feels swollen  Was seen here 2 days ago for HTN and ws sent to the ED  Pt alert and oriented x'3 denies any weakness  Pt does clo chest pain under left breast which she said started this am when she woke up
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
## 2024-01-25 ENCOUNTER — Ambulatory Visit: Admission: EM | Admit: 2024-01-25 | Discharge: 2024-01-25 | Disposition: A | Discharge status: Home / Self Care

## 2024-01-25 DIAGNOSIS — M109 Gout, unspecified: Secondary | ICD-10-CM | POA: Diagnosis not present

## 2024-01-25 MED ORDER — COLCHICINE 0.6 MG PO TABS: 0.6000 mg | ORAL_TABLET | Freq: Two times a day (BID) | ORAL | 0 refills | Status: AC

## 2024-01-25 MED ORDER — KETOROLAC TROMETHAMINE 30 MG/ML IJ SOLN
30.0000 mg | Freq: Once | INTRAMUSCULAR | Status: AC
  Administered 2024-01-25: 30 mg via INTRAMUSCULAR
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
## 2024-01-25 NOTE — ED Triage Notes (Signed)
 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 This started early yesterday and I am on a daily medication but this morning much worse. No injury.

## 2024-05-16 ENCOUNTER — Institutional Professional Consult (permissible substitution): Payer: Self-pay
